# Patient Record
Sex: Female | Born: 1960 | Race: White | Hispanic: No | Marital: Single | Smoking: Never smoker
Health system: Southern US, Community
[De-identification: ages and names within clinical notes are randomized; demographics above are authoritative.]

## PROBLEM LIST (undated history)

## (undated) DIAGNOSIS — Z566 Other physical and mental strain related to work: Secondary | ICD-10-CM

## (undated) DIAGNOSIS — M7661 Achilles tendinitis, right leg: Secondary | ICD-10-CM

## (undated) DIAGNOSIS — D509 Iron deficiency anemia, unspecified: Secondary | ICD-10-CM

## (undated) DIAGNOSIS — R011 Cardiac murmur, unspecified: Secondary | ICD-10-CM

## (undated) DIAGNOSIS — F5101 Primary insomnia: Secondary | ICD-10-CM

## (undated) DIAGNOSIS — M898X7 Other specified disorders of bone, ankle and foot: Secondary | ICD-10-CM

## (undated) DIAGNOSIS — G4733 Obstructive sleep apnea (adult) (pediatric): Secondary | ICD-10-CM

## (undated) DIAGNOSIS — I10 Essential (primary) hypertension: Secondary | ICD-10-CM

## (undated) DIAGNOSIS — R002 Palpitations: Secondary | ICD-10-CM

## (undated) DIAGNOSIS — I447 Left bundle-branch block, unspecified: Secondary | ICD-10-CM

## (undated) HISTORY — PX: BREAST REDUCTION SURGERY: SHX8

## (undated) HISTORY — PX: CHOLECYSTECTOMY: SHX55

## (undated) HISTORY — DX: Obstructive sleep apnea (adult) (pediatric): G47.33

## (undated) HISTORY — DX: Primary insomnia: F51.01

## (undated) HISTORY — DX: Essential (primary) hypertension: I10

## (undated) HISTORY — DX: Palpitations: R00.2

## (undated) HISTORY — DX: Left bundle-branch block, unspecified: I44.7

## (undated) HISTORY — DX: Cardiac murmur, unspecified: R01.1

## (undated) HISTORY — DX: Other physical and mental strain related to work: Z56.6

## (undated) HISTORY — PX: GASTRIC FUNDOPLICATION: SHX226

## (undated) HISTORY — DX: Iron deficiency anemia, unspecified: D50.9

## (undated) HISTORY — PX: ACHILLES TENDON SURGERY: SHX542

## (undated) HISTORY — DX: Achilles tendinitis, right leg: M76.61

## (undated) HISTORY — DX: Other specified disorders of bone, ankle and foot: M89.8X7

## (undated) HISTORY — PX: GASTRIC BYPASS: SHX52

---

## 2014-08-30 DIAGNOSIS — R011 Cardiac murmur, unspecified: Secondary | ICD-10-CM

## 2014-08-30 DIAGNOSIS — I10 Essential (primary) hypertension: Secondary | ICD-10-CM

## 2014-08-30 HISTORY — DX: Essential (primary) hypertension: I10

## 2014-08-30 HISTORY — DX: Cardiac murmur, unspecified: R01.1

## 2015-04-12 DIAGNOSIS — D509 Iron deficiency anemia, unspecified: Secondary | ICD-10-CM | POA: Insufficient documentation

## 2015-04-12 DIAGNOSIS — Z566 Other physical and mental strain related to work: Secondary | ICD-10-CM | POA: Insufficient documentation

## 2015-04-12 HISTORY — DX: Other physical and mental strain related to work: Z56.6

## 2015-04-12 HISTORY — DX: Iron deficiency anemia, unspecified: D50.9

## 2015-05-03 DIAGNOSIS — M898X7 Other specified disorders of bone, ankle and foot: Secondary | ICD-10-CM | POA: Insufficient documentation

## 2015-05-03 DIAGNOSIS — M7661 Achilles tendinitis, right leg: Secondary | ICD-10-CM | POA: Insufficient documentation

## 2015-05-03 HISTORY — DX: Other specified disorders of bone, ankle and foot: M89.8X7

## 2015-05-03 HISTORY — DX: Achilles tendinitis, right leg: M76.61

## 2016-10-21 DIAGNOSIS — F5101 Primary insomnia: Secondary | ICD-10-CM | POA: Insufficient documentation

## 2016-10-21 HISTORY — DX: Primary insomnia: F51.01

## 2017-04-05 DIAGNOSIS — R002 Palpitations: Secondary | ICD-10-CM | POA: Insufficient documentation

## 2017-04-05 DIAGNOSIS — I447 Left bundle-branch block, unspecified: Secondary | ICD-10-CM

## 2017-04-05 HISTORY — DX: Palpitations: R00.2

## 2017-04-05 HISTORY — DX: Left bundle-branch block, unspecified: I44.7

## 2017-04-09 DIAGNOSIS — I493 Ventricular premature depolarization: Secondary | ICD-10-CM | POA: Insufficient documentation

## 2017-04-09 NOTE — Progress Notes (Signed)
Cardiology Office Note:    Date:  04/10/2017   ID:  Sophia SitesLisa Scott, DOB 10-06-1960, MRN [id number]  PCP:  Jamal CollinHedgecock, Suzanne, PA-C  Cardiologist:  Norman HerrlichBrian Mersedes Alber, MD    Referring MD: Jamal CollinHedgecock, Suzanne, PA-C    ASSESSMENT:    1. Palpitation   2. PVC's (premature ventricular contractions)   3. Left bundle branch block (LBBB)   4. Essential hypertension    PLAN:    In order of problems listed above:  1. Worsened she utilize an event monitor for 1 month document arrhythmia and formulate decision on treatment whether she requires an antiarrhythmic drug.  Recent labs for potassium requested from her PCP recheck echocardiogram at risk for cardiomyopathy with left bundle branch block 2. Continue beta-blocker 3. Check echocardiogram 4. Stable continue current treatment   Next appointment: 6 weeks   Medication Adjustments/Labs and Tests Ordered: Current medicines are reviewed at length with the patient today.  Concerns regarding medicines are outlined above.  Orders Placed This Encounter  Procedures  . Cardiac event monitor  . EKG 12-Lead  . ECHOCARDIOGRAM COMPLETE   No orders of the defined types were placed in this encounter.   Chief Complaint  Patient presents with  . Follow-up    to discuss irregular heart beat.      History of Present Illness:    Sophia SitesLisa Scott is a 57 y.o. female with a hx of 08/28/17 last seen hypertension, PVC's and LBBB. Compliance with diet, lifestyle and medications: Yes  Recently her symptoms have worsened one evening she had such severe palpitation that are frightened her and she thought of going to the emergency room.  The episodes are daily they occur in the evening hours and is forceful and rapid heartbeat that she complains of.  She is short of breath with palpitation but has no chest pain exercise intolerance exertional syncope edema or chest discomfort.  She takes no proarrhythmic medications. Past Medical History:  Diagnosis Date  . Essential  hypertension 08/30/2014  . Iron deficiency anemia 04/12/2015  . Left bundle branch block (LBBB) 04/05/2017  . Murmur 08/30/2014  . Palpitations 04/05/2017  . Primary insomnia 10/21/2016  . 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 Tendonitis, Achilles, right 05/03/2015  . Work-related stress 04/12/2015    Past Surgical History:  Procedure Laterality Date  . ACHILLES TENDON SURGERY Right   . BREAST REDUCTION SURGERY    . CHOLECYSTECTOMY    . GASTRIC BYPASS    . GASTRIC FUNDOPLICATION      Current Medications: Current Meds  Medication Sig  . clonazePAM (KLONOPIN) 1 MG tablet TAKE 1/2 TO 1 (ONE-HALF TO ONE) TABLET BY MOUTH UP TO TWICE DAILY AS NEEDED FOR ANXIETY  . metoprolol succinate (TOPROL-XL) 50 MG 24 hr tablet Take 50 mg by mouth daily.  . sertraline (ZOLOFT) 100 MG tablet Take 100 mg by mouth daily.  . traZODone (DESYREL) 50 MG tablet Take 50 mg by mouth daily as needed.     Allergies:   Chlorphen-pe-acetaminophen   Social History   Socioeconomic History  . Marital status: Single    Spouse name: None  . Number of children: None  . Years of education: None  . Highest education level: None  Social Needs  . Financial resource strain: None  . Food insecurity - worry: None  . Food insecurity - inability: None  . Transportation needs - medical: None  . Transportation needs - non-medical: None  Occupational History  . None  Tobacco Use  . Smoking status: Never  Smoker  . Smokeless tobacco: Never Used  Substance and Sexual Activity  . Alcohol use: Yes    Frequency: Never    Comment: occassionaly  . Drug use: No  . Sexual activity: None  Other Topics Concern  . None  Social History Narrative  . None     Family History: The patient's family history includes Cancer in her father; Heart disease in her father; Hypertension in her mother. ROS:   Please see the history of present illness.    All other systems reviewed and are negative.  EKGs/Labs/Other Studies Reviewed:     The following studies were reviewed today:  EKG:  EKG ordered today.  The ekg ordered today demonstrates sinus rhythm left bundle branch block  Recent Labs: No results found for requested labs within last 8760 hours.  Recent Lipid Panel No results found for: CHOL, TRIG, HDL, CHOLHDL, VLDL, LDLCALC, LDLDIRECT  Physical Exam:    VS:  BP 122/82 (BP Location: Right Arm, Patient Position: Sitting, Cuff Size: Normal)   Pulse 72   Ht 5\' 2"  (1.575 m)   Wt 181 lb 6.4 oz (82.3 kg)   SpO2 99%   BMI 33.18 kg/m     Wt Readings from Last 3 Encounters:  04/10/17 181 lb 6.4 oz (82.3 kg)     GEN:  Well nourished, well developed in no acute distress HEENT: Normal NECK: No JVD; No carotid bruits LYMPHATICS: No lymphadenopathy CARDIAC: paradoxical S2 RRR, no murmurs, rubs, gallops RESPIRATORY:  Clear to auscultation without rales, wheezing or rhonchi  ABDOMEN: Soft, non-tender, non-distended MUSCULOSKELETAL:  No edema; No deformity  SKIN: Warm and dry NEUROLOGIC:  Alert and oriented x 3 PSYCHIATRIC:  Normal affect    Signed, [name], MD  04/10/2017 11:32 AM    [facility]

## 2017-04-10 ENCOUNTER — Encounter: Payer: Self-pay | Admitting: *Deleted

## 2017-04-10 ENCOUNTER — Ambulatory Visit: Payer: BC Managed Care – PPO | Admitting: Cardiology

## 2017-04-10 ENCOUNTER — Encounter: Payer: Self-pay | Admitting: Cardiology

## 2017-04-10 VITALS — BP 122/82 | HR 72 | Ht 62.0 in | Wt 181.4 lb

## 2017-04-10 DIAGNOSIS — R002 Palpitations: Secondary | ICD-10-CM

## 2017-04-10 DIAGNOSIS — I493 Ventricular premature depolarization: Secondary | ICD-10-CM | POA: Diagnosis not present

## 2017-04-10 DIAGNOSIS — I10 Essential (primary) hypertension: Secondary | ICD-10-CM | POA: Diagnosis not present

## 2017-04-10 DIAGNOSIS — I447 Left bundle-branch block, unspecified: Secondary | ICD-10-CM

## 2017-04-10 NOTE — Patient Instructions (Addendum)
  Medication Instructions:  Your physician recommends that you continue on your current medications as directed. Please refer to the Current Medication list given to you today.   Labwork: None  Testing/Procedures: You had an EKG today.   Your physician has recommended that you wear an event monitor. Event monitors are medical devices that record the heart's electrical activity. Doctors most often us these monitors to diagnose arrhythmias. Arrhythmias are problems with the speed or rhythm of the heartbeat. The monitor is a small, portable device. You can wear one while you do your normal daily activities. This is usually used to diagnose what is causing palpitations/syncope (passing out). Wear for 30 days.   Your physician has requested that you have an echocardiogram. Echocardiography is a painless test that uses sound waves to create images of your heart. It provides your doctor with information about the size and shape of your heart and how well your heart's chambers and valves are working. This procedure takes approximately one hour. There are no restrictions for this procedure.    Follow-Up: Your physician recommends that you schedule a follow-up appointment in: 6 weeks.   Any Other Special Instructions Will Be Listed Below (If Applicable).     If you need a refill on your cardiac medications before your next appointment, please call your pharmacy.     1. Avoid all over-the-counter antihistamines except Claritin/Loratadine and Zyrtec/Cetrizine. 2. Avoid all combination including cold sinus allergies flu decongestant and sleep medications 3. You can use Robitussin DM Mucinex and Mucinex DM for cough. 4. can use Tylenol aspirin ibuprofen and naproxen but no combinations such as sleep or sinus.  KardiaMobile [email or web address]        FDA-cleared, clinical grade mobile EKG monitor: Lourena SimmondsKardia is the most clinically-validated mobile EKG used by the  world's leading cardiac care medical professionals With Basic service, know instantly if your heart rhythm is normal or if atrial fibrillation is detected, and email the last single EKG recording to yourself or your doctor Premium service, available for purchase through the Kardia app for $9.99 per month or $99 per year, includes unlimited history and storage of your EKG recordings, a monthly EKG summary report to share with your doctor, along with the ability to track your blood pressure, activity and weight Includes one KardiaMobile phone clip FREE SHIPPING: Standard delivery 1-3 business days. Orders placed by 11:00am PST will ship that afternoon. Otherwise, will ship next business day. All orders ship via [employer] Priority Mail from [place]

## 2017-04-13 ENCOUNTER — Ambulatory Visit (HOSPITAL_BASED_OUTPATIENT_CLINIC_OR_DEPARTMENT_OTHER)
Admission: RE | Admit: 2017-04-13 | Discharge: 2017-04-13 | Disposition: A | Discharge status: Home / Self Care | Payer: BC Managed Care – PPO | Source: Ambulatory Visit | Attending: Cardiology | Admitting: Cardiology

## 2017-04-13 ENCOUNTER — Ambulatory Visit: Payer: BC Managed Care – PPO

## 2017-04-13 DIAGNOSIS — R002 Palpitations: Secondary | ICD-10-CM | POA: Insufficient documentation

## 2017-04-13 DIAGNOSIS — I471 Supraventricular tachycardia: Secondary | ICD-10-CM | POA: Diagnosis not present

## 2017-04-13 DIAGNOSIS — I10 Essential (primary) hypertension: Secondary | ICD-10-CM | POA: Insufficient documentation

## 2017-04-13 NOTE — Progress Notes (Signed)
Echocardiogram 2D Echocardiogram has been performed.  Dorothey BasemanReel, Anjolaoluwa Siguenza M 04/13/2017, 12:02 PM

## 2017-04-23 ENCOUNTER — Other Ambulatory Visit: Payer: Self-pay | Admitting: Cardiology

## 2017-04-23 DIAGNOSIS — I471 Supraventricular tachycardia: Secondary | ICD-10-CM

## 2017-04-23 MED ORDER — DILTIAZEM HCL ER COATED BEADS 240 MG PO CP24: 240.0000 mg | ORAL_CAPSULE | Freq: Every day | ORAL | 3 refills | Status: DC

## 2017-04-24 ENCOUNTER — Telehealth: Payer: Self-pay

## 2017-04-24 NOTE — Telephone Encounter (Signed)
Spoke with patient advising her that she has had 3 events which did show us that she was having fast heart beat (SVT). I have let her know that Diltiazem 240 mg, 1 time daily. Patient understands to continue to wear her monitor to make sure she is not having any more episodes. I advised her to let us know if she had any issues with Diltiazem.

## 2017-04-30 ENCOUNTER — Other Ambulatory Visit: Payer: Self-pay

## 2017-04-30 DIAGNOSIS — I447 Left bundle-branch block, unspecified: Secondary | ICD-10-CM

## 2017-04-30 DIAGNOSIS — R002 Palpitations: Secondary | ICD-10-CM

## 2017-05-01 ENCOUNTER — Encounter: Payer: Self-pay | Admitting: Cardiology

## 2017-05-01 ENCOUNTER — Ambulatory Visit: Payer: BC Managed Care – PPO | Admitting: Cardiology

## 2017-05-01 ENCOUNTER — Telehealth: Payer: Self-pay | Admitting: *Deleted

## 2017-05-01 ENCOUNTER — Other Ambulatory Visit: Payer: Self-pay | Admitting: Cardiology

## 2017-05-01 VITALS — BP 130/78 | HR 66 | Ht 62.0 in | Wt 184.0 lb

## 2017-05-01 DIAGNOSIS — I447 Left bundle-branch block, unspecified: Secondary | ICD-10-CM

## 2017-05-01 DIAGNOSIS — I471 Supraventricular tachycardia: Secondary | ICD-10-CM | POA: Diagnosis not present

## 2017-05-01 DIAGNOSIS — Z01812 Encounter for preprocedural laboratory examination: Secondary | ICD-10-CM | POA: Diagnosis not present

## 2017-05-01 DIAGNOSIS — D500 Iron deficiency anemia secondary to blood loss (chronic): Secondary | ICD-10-CM

## 2017-05-01 DIAGNOSIS — I10 Essential (primary) hypertension: Secondary | ICD-10-CM

## 2017-05-01 NOTE — Progress Notes (Signed)
Electrophysiology Office Note   Date:  05/01/2017   ID:  Sophia SitesLisa Scott, DOB 05/25/1960, MRN [id number]  PCP:  Sophia CollinHedgecock, Suzanne, PA-C  Cardiologist:  Sophia Scott Primary Electrophysiologist:  Sophia LemmingWill Martin Marda Breidenbach, MD    Chief Complaint  Patient presents with  . Advice Only    Ventricular standstill     History of Present Illness: Sophia Scott is a 57 y.o. female who is being seen today for the evaluation of SVT at the request of Sophia HerrlichBrian Scott. Presenting today for electrophysiology evaluation.  She has a history of hypertension, left bundle branch block.  She presented to cardiology clinic with severe palpitations.  She went to the emergency room due to this.  They occur mainly in the evening hours and feel like a forceful rapid heartbeat.  She has shortness of breath but no chest pain.  Heart attack monitor showed episodes of SVT as well as ventricular standstill.  Her rate control has since been stopped.  Today, she denies symptoms of palpitations, chest pain, shortness of breath, orthopnea, PND, lower extremity edema, claudication, dizziness, presyncope, syncope, bleeding, or neurologic sequela. The patient is tolerating medications without difficulties.    Past Medical History:  Diagnosis Date  . Essential hypertension 08/30/2014  . Iron deficiency anemia 04/12/2015  . Left bundle branch block (LBBB) 04/05/2017  . Murmur 08/30/2014  . Palpitations 04/05/2017  . Primary insomnia 10/21/2016  . 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 Tendonitis, Achilles, right 05/03/2015  . Work-related stress 04/12/2015   Past Surgical History:  Procedure Laterality Date  . ACHILLES TENDON SURGERY Right   . BREAST REDUCTION SURGERY    . CHOLECYSTECTOMY    . GASTRIC BYPASS    . GASTRIC FUNDOPLICATION       Current Outpatient Medications  Medication Sig Dispense Refill  . clonazePAM (KLONOPIN) 1 MG tablet TAKE 1/2 TO 1 (ONE-HALF TO ONE) TABLET BY MOUTH UP TO TWICE DAILY AS NEEDED FOR ANXIETY    . olmesartan  (BENICAR) 40 MG tablet Take 40 mg by mouth daily.    . ondansetron (ZOFRAN) 4 MG tablet Take 4 mg by mouth as needed.    . sertraline (ZOLOFT) 100 MG tablet Take 100 mg by mouth daily.    . traZODone (DESYREL) 50 MG tablet Take 50 mg by mouth daily as needed.     No current facility-administered medications for this visit.     Allergies:   Chlorphen-pe-acetaminophen   Social History:  The patient  reports that she has never smoked. She has never used smokeless tobacco. She reports that she drinks alcohol. She reports that she does not use drugs.   Family History:  The patient's family history includes Cancer in her father; Heart disease in her father; Hypertension in her mother.    ROS:  Please see the history of present illness.   Otherwise, review of systems is positive for chest pain, palpitations, hearing loss, anxiety.   All other systems are reviewed and negative.    PHYSICAL EXAM: VS:  BP 130/78   Pulse 66   Ht 5\' 2"  (1.575 m)   Wt 184 lb (83.5 kg)   SpO2 99%   BMI 33.65 kg/m  , BMI Body mass index is 33.65 kg/m. GEN: Well nourished, well developed, in no acute distress  HEENT: normal  Neck: no JVD, carotid bruits, or masses Cardiac: RRR; no murmurs, rubs, or gallops,no edema  Respiratory:  clear to auscultation bilaterally, normal work of breathing GI: soft, nontender, nondistended, + BS MS: no  deformity or atrophy  Skin: warm and dry Neuro:  Strength and sensation are intact Psych: euthymic mood, full affect  EKG:  EKG is not ordered today. Personal review of the ekg ordered 04/09/17 shows sinus rhythm, left bundle branch block  04/09/17 Device interrogation is reviewed today in detail.  See PaceArt for details.   Recent Labs: No results found for requested labs within last 8760 hours.    Lipid Panel  No results found for: CHOL, TRIG, HDL, CHOLHDL, VLDL, LDLCALC, LDLDIRECT   Wt Readings from Last 3 Encounters:  05/01/17 184 lb (83.5 kg)  04/10/17 181 lb 6.4  oz (82.3 kg)      Other studies Reviewed: Additional studies/ records that were reviewed today include: TTE 04/13/17  Review of the above records today demonstrates:  - Left ventricle: The cavity size was normal. Wall thickness was   normal. Systolic function was normal. The estimated ejection   fraction was in the range of 55% to 60%. Wall motion was normal;   there were no regional wall motion abnormalities. There was an   increased relative contribution of atrial contraction to   ventricular filling. Doppler parameters are consistent with high   ventricular filling pressure. - Aortic valve: Valve area (VTI): 1.79 cm^2. Valve area (Vmax):   1.81 cm^2. Valve area (Vmean): 1.78 cm^2. - Left atrium: The atrium was mildly dilated.  30-day monitor-personally reviewed Sinus rhythm, left bundle branch block, SVT, intermittent AV block  ASSESSMENT AND PLAN:  1.  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  Appeared on her cardiac monitor.  She does have a chronic left bundle branch block which was unchanged during her tachycardia.  She has a normal ejection fraction.  Due to her SVT, [name] plan for ablation.  Risks and benefits were discussed.  Risks include bleeding, tamponade, heart block, and stroke, among others.  She understands these risks and is agreed to the procedure.  Discussed with her vagal maneuvers in case she goes back into SVT.  2.  Left bundle branch block: Chronic.  Has a normal ejection fraction.  Continue to monitor.  3.  Intermittent AV block: Occurred while she was on rate controlling medications for her SVT.  These have been stopped.  Plan for SVT ablation.  Case discussed with primary cardiology  Current medicines are reviewed at length with the patient today.   The patient does not have concerns regarding her medicines.  The following changes were made today:  none  Labs/ tests ordered today include:  Orders Placed This Encounter  Procedures  . Basic Metabolic Panel (BMET)    . CBC w/Diff     Disposition:   FU with [name] 7 weeks  Signed, Sophia Scott Sophia Loa, MD  05/01/2017 10:56 AM     [facility] [address] [place] [address] [phone] (office) [phone] (fax)

## 2017-05-01 NOTE — Telephone Encounter (Signed)
Pt cannot go on 4/25 for ablation.  She would like to move it up to 4/16. Advised pt to have pre procedure labs (in [facility] office) b/t 4/2 - 4/15. Rescheduled post ablation f/u appt to 5/22 in the [facility] office. Pt aware to [facility] SVT ablation on 05/19/17. She is aware all other instructions given to her this morning remain the same. Patient verbalized understanding and agreeable to plan.

## 2017-05-01 NOTE — Patient Instructions (Addendum)
Medication Instructions:  Your physician recommends that you continue on your current medications as directed. Please refer to the Current Medication list given to you today.  Labwork: Return for pre procedure lab work between: 4/11 - 4/24 for BMET & CBC  *Will notify you of abnormal results, otherwise continue current treatment plan.  Testing/Procedures: Your physician has recommended that you have an ablation. Catheter ablation is a medical procedure used to treat some cardiac arrhythmias (irregular heartbeats). During catheter ablation, a long, thin, flexible tube is put into a blood vessel in your groin (upper thigh), or neck. This tube is called an ablation catheter. It is then guided to your heart through the blood vessel. Radio frequency waves destroy small areas of heart tissue where abnormal heartbeats may cause an arrhythmia to start.  Instructions for your ablation: 1. Please arrive at the [facility], Main Entrance "A", of [facility] at 9:30 a.m. on 05/28/2017. 2. Do not eat or drink after midnight the night prior to the procedure. 3. You may take your morning medications with a sip of water. 4. You will shaved for this procedure (if needed). Typically both groins, chest and back  are shaven. We ask that you shave these areas yourself at home 1-2 days prior  to the procedure. If you are uncomfortable/unable, then hospital staff will shave  these areas the morning of your procedure. 5. Plan for an overnight stay in the hospital. 6. You will need someone to drive you home at discharge.   Follow-Up: Your physician recommends that you schedule a follow-up appointment in: 4 weeks, after your procedure on 05/28/2017, with Dr. Elberta Fortis.  * If you need a refill on your cardiac medications before your next appointment, please call your pharmacy.   *Please note that any paperwork needing to be filled out by the provider will need to be addressed at the front desk prior to seeing the  provider. Please note that any FMLA, disability or other documents regarding health condition is subject to a $25.00 charge that must be received prior to completion of paperwork in the form of a money order or check.  Thank you for choosing [facility]!!   Dory Horn, RN [phone]  Any Other Special Instructions Will Be Listed Below (If Applicable).   Cardiac Ablation Cardiac ablation is a procedure to disable (ablate) a small amount of heart tissue in very specific places. The heart has many electrical connections. Sometimes these connections are abnormal and can cause the heart to beat very fast or irregularly. Ablating some of the problem areas can improve the heart rhythm or return it to normal. Ablation may be done for people who:  Have Wolff-Parkinson-White syndrome.  Have fast heart rhythms (tachycardia).  Have taken medicines for an abnormal heart rhythm (arrhythmia) that were not effective or caused side effects.  Have a high-risk heartbeat that may be life-threatening.  During the procedure, a small incision is made in the neck or the groin, and a long, thin, flexible tube (catheter) is inserted into the incision and moved to the heart. Small devices (electrodes) on the tip of the catheter will send out electrical currents. A type of X-ray (fluoroscopy) will be used to help guide the catheter and to provide images of the heart. Tell a health care provider about:  Any allergies you have.  All medicines you are taking, including vitamins, herbs, eye drops, creams, and over-the-counter medicines.  Any problems you or family members have had with anesthetic medicines.  Any blood disorders you have.  Any surgeries you have had.  Any medical conditions you have, such as kidney failure.  Whether you are pregnant or may be pregnant. What are the risks? Generally, this is a safe procedure. However, problems may occur, including:  Infection.  Bruising and  bleeding at the catheter insertion site.  Bleeding into the chest, especially into the sac that surrounds the heart. This is a serious complication.  Stroke or blood clots.  Damage to other structures or organs.  Allergic reaction to medicines or dyes.  Need for a permanent pacemaker if the normal electrical system is damaged. A pacemaker is a small computer that sends electrical signals to the heart and helps your heart beat normally.  The procedure not being fully effective. This may not be recognized until months later. Repeat ablation procedures are sometimes required.  What happens before the procedure?  Follow instructions from your health care provider about eating or drinking restrictions.  Ask your health care provider about: ? Changing or stopping your regular medicines. This is especially important if you are taking diabetes medicines or blood thinners. ? Taking medicines such as aspirin and ibuprofen. These medicines can thin your blood. Do not take these medicines before your procedure if your health care provider instructs you not to.  Plan to have someone take you home from the hospital or clinic.  If you will be going home right after the procedure, plan to have someone with you for 24 hours. What happens during the procedure?  To lower your risk of infection: ? Your health care team will wash or sanitize their hands. ? Your skin will be washed with soap. ? Hair may be removed from the incision area.  An IV tube will be inserted into one of your veins.  You will be given a medicine to help you relax (sedative).  The skin on your neck or groin will be numbed.  An incision will be made in your neck or your groin.  A needle will be inserted through the incision and into a large vein in your neck or groin.  A catheter will be inserted into the needle and moved to your heart.  Dye may be injected through the catheter to help your surgeon see the area of the  heart that needs treatment.  Electrical currents will be sent from the catheter to ablate heart tissue in desired areas. There are three types of energy that may be used to ablate heart tissue: ? Heat (radiofrequency energy). ? Laser energy. ? Extreme cold (cryoablation).  When the necessary tissue has been ablated, the catheter will be removed.  Pressure will be held on the catheter insertion area to prevent excessive bleeding.  A bandage (dressing) will be placed over the catheter insertion area. The procedure may vary among health care providers and hospitals. What happens after the procedure?  Your blood pressure, heart rate, breathing rate, and blood oxygen level will be monitored until the medicines you were given have worn off.  Your catheter insertion area will be monitored for bleeding. You will need to lie still for a few hours to ensure that you do not bleed from the catheter insertion area.  Do not drive for 5-7 days or as long as directed by your health care provider. Summary  Cardiac ablation is a procedure to disable (ablate) a small amount of heart tissue in very specific places. Ablating some of the problem areas can improve the heart rhythm or return  it to normal.  During the procedure, electrical currents will be sent from the catheter to ablate heart tissue in desired areas. This information is not intended to replace advice given to you by your health care provider. Make sure you discuss any questions you have with your health care provider. Document Released: 06/08/2008 Document Revised: 12/10/2015 Document Reviewed: 12/10/2015 Elsevier Interactive Patient Education  [employer].

## 2017-05-05 ENCOUNTER — Institutional Professional Consult (permissible substitution): Payer: 59 | Admitting: Cardiology

## 2017-05-14 ENCOUNTER — Other Ambulatory Visit: Payer: Self-pay

## 2017-05-14 DIAGNOSIS — Z01812 Encounter for preprocedural laboratory examination: Secondary | ICD-10-CM

## 2017-05-14 DIAGNOSIS — I471 Supraventricular tachycardia: Secondary | ICD-10-CM

## 2017-05-14 NOTE — Addendum Note (Signed)
Addended by: Sharene ButtersWELLS, Betzayda Braxton E on: 05/14/2017 04:23 PM   Modules accepted: Orders

## 2017-05-15 LAB — CBC WITH DIFFERENTIAL/PLATELET
Basophils Absolute: 0 10*3/uL (ref 0.0–0.2)
Basos: 1 %
EOS (ABSOLUTE): 0.1 10*3/uL (ref 0.0–0.4)
EOS: 1 %
HEMATOCRIT: 30.5 % — AB (ref 34.0–46.6)
HEMOGLOBIN: 9.1 g/dL — AB (ref 11.1–15.9)
IMMATURE GRANS (ABS): 0 10*3/uL (ref 0.0–0.1)
IMMATURE GRANULOCYTES: 0 %
LYMPHS ABS: 1.2 10*3/uL (ref 0.7–3.1)
LYMPHS: 31 %
MCH: 22.4 pg — ABNORMAL LOW (ref 26.6–33.0)
MCHC: 29.8 g/dL — ABNORMAL LOW (ref 31.5–35.7)
MCV: 75 fL — ABNORMAL LOW (ref 79–97)
MONOCYTES: 11 %
Monocytes Absolute: 0.5 10*3/uL (ref 0.1–0.9)
NEUTROS PCT: 56 %
Neutrophils Absolute: 2.2 10*3/uL (ref 1.4–7.0)
Platelets: 334 10*3/uL (ref 150–379)
RBC: 4.06 x10E6/uL (ref 3.77–5.28)
RDW: 16.5 % — ABNORMAL HIGH (ref 12.3–15.4)
WBC: 4 10*3/uL (ref 3.4–10.8)

## 2017-05-15 LAB — BASIC METABOLIC PANEL
BUN/Creatinine Ratio: 11 (ref 9–23)
BUN: 8 mg/dL (ref 6–24)
CALCIUM: 9.2 mg/dL (ref 8.7–10.2)
CO2: 27 mmol/L (ref 20–29)
CREATININE: 0.73 mg/dL (ref 0.57–1.00)
Chloride: 100 mmol/L (ref 96–106)
GFR calc Af Amer: 108 mL/min/{1.73_m2} (ref >59)
GFR calc non Af Amer: 94 mL/min/{1.73_m2} (ref >59)
Glucose: 85 mg/dL (ref 65–99)
Potassium: 4.4 mmol/L (ref 3.5–5.2)
Sodium: 141 mmol/L (ref 134–144)

## 2017-05-15 LAB — SPECIMEN STATUS REPORT

## 2017-05-18 ENCOUNTER — Telehealth: Payer: Self-pay | Admitting: Cardiology

## 2017-05-18 NOTE — Telephone Encounter (Signed)
Pt calls in reporting sickness that "came on Friday".  Reports going to urgent care Saturday, flu test negative, but started on Tamiflu anyway. She also reports a sinus infection with low grade fever. Pt scheduled for SVT ablation tomorrow.  Cancelling procedure and re-scheduling for 5/2. Pt aware I will call her with instructions.

## 2017-05-18 NOTE — Telephone Encounter (Signed)
New message:     Pt states she is sick and due to have a procedure done on tomorrow and pt would like to know how to proceed.

## 2017-05-21 NOTE — Telephone Encounter (Signed)
Reviewed procedure instructions w/ patient. Advised to arrive [facility] at 8am on 06/04/17. Post procedure follow up rescheduled for 07/13/17. Patient verbalized understanding and agreeable to plan.

## 2017-05-21 NOTE — Progress Notes (Signed)
Cardiology Office Note:    Date:  05/22/2017   ID:  Sophia Scott, Sophia Scott 1960/03/18, MRN [id number]  PCP:  Jamal Collin, PA-C  Cardiologist:  [name], MD    Referring MD: Jamal Collin, PA-C    ASSESSMENT:    1. Left bundle branch block (LBBB)   2. SVT (supraventricular tachycardia) (HCC)   3. Essential hypertension   4. PVC's (premature ventricular contractions)   5. Heart block AV third degree (HCC)    PLAN:    In order of problems listed above:  1. Stable on recent EKG 2. Stable awaiting catheter ablation 3. Stable blood pressure target continue current treatment 4. Stable beta-blocker is on hold 5. Improved off combined beta-blocker calcium channel blocker therapy   Next appointment: 6 months   Medication Adjustments/Labs and Tests Ordered: Current medicines are reviewed at length with the patient today.  Concerns regarding medicines are outlined above.  No orders of the defined types were placed in this encounter.  No orders of the defined types were placed in this encounter.   Chief Complaint  Patient presents with  . Follow up testing    History of Present Illness:    Sophia Scott is a 57 y.o. female with a hx of hypertension, PVC's and LBBB last seen 04/10/17 Her EM showed SVT and subsequent pause and is scheduled for EP ablation 06/04/17. Compliance with diet, lifestyle and medications: Yes She is noted no syncope or sustained arrhythmia and has been seen by EP and is planned for catheter ablation of her SVT 06/04/2017.  She avoids over-the-counter proarrhythmic drugs Past Medical History:  Diagnosis Date  . Essential hypertension 08/30/2014  . Iron deficiency anemia 04/12/2015  . Left bundle branch block (LBBB) 04/05/2017  . Murmur 08/30/2014  . Palpitations 04/05/2017  . Primary insomnia 10/21/2016  . 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 Tendonitis, Achilles, right 05/03/2015  . Work-related stress 04/12/2015    Past Surgical  History:  Procedure Laterality Date  . ACHILLES TENDON SURGERY Right   . BREAST REDUCTION SURGERY    . CHOLECYSTECTOMY    . GASTRIC BYPASS    . GASTRIC FUNDOPLICATION      Current Medications: Current Meds  Medication Sig  . amoxicillin-clavulanate (AUGMENTIN) 875-125 MG tablet Take 1 tablet by mouth 2 (two) times daily.  . clonazePAM (KLONOPIN) 1 MG tablet TAKE 1/2 TO 1 (ONE-HALF TO ONE) TABLET BY MOUTH UP TO TWICE DAILY AS NEEDED FOR ANXIETY  . ondansetron (ZOFRAN) 4 MG tablet Take 4 mg by mouth every 8 (eight) hours as needed for nausea.   Marland Kitchen oxymetazoline (VICKS SINEX 12 HOUR DECONGEST) 0.05 % nasal spray Place 1 spray into both nostrils 2 (two) times daily as needed for congestion.  . sertraline (ZOLOFT) 100 MG tablet Take 100 mg by mouth daily.  . traZODone (DESYREL) 50 MG tablet Take 50 mg by mouth at bedtime.      Allergies:   Chlorphen-pe-acetaminophen   Social History   Socioeconomic History  . Marital status: Single    Spouse name: Not on file  . Number of children: Not on file  . Years of education: Not on file  . Highest education level: Not on file  Occupational History  . Not on file  Social Needs  . Financial resource strain: Not on file  . Food insecurity:    Worry: Not on file    Inability: Not on file  . Transportation needs:    Medical: Not on file  Non-medical: Not on file  Tobacco Use  . Smoking status: Never Smoker  . Smokeless tobacco: Never Used  Substance and Sexual Activity  . Alcohol use: Yes    Frequency: Never    Comment: occassionaly  . Drug use: No  . Sexual activity: Not on file  Lifestyle  . Physical activity:    Days per week: Not on file    Minutes per session: Not on file  . Stress: Not on file  Relationships  . Social connections:    Talks on phone: Not on file    Gets together: Not on file    Attends religious service: Not on file    Active member of club or organization: Not on file    Attends meetings of clubs or  organizations: Not on file    Relationship status: Not on file  Other Topics Concern  . Not on file  Social History Narrative  . Not on file     Family History: The patient's family history includes Cancer in her father; Heart disease in her father; Hypertension in her mother. ROS:   Please see the history of present illness.    All other systems reviewed and are negative.  EKGs/Labs/Other Studies Reviewed:    The following studies were reviewed   Recent Labs: 05/14/2017: BUN 8; Creatinine, Ser 0.73; Hemoglobin 9.1; Platelets 334; Potassium 4.4; Sodium 141  Recent Lipid Panel No results found for: CHOL, TRIG, HDL, CHOLHDL, VLDL, LDLCALC, LDLDIRECT  Physical Exam:    VS:  BP 130/80   Pulse 82   Ht 5\' 2"  (1.575 m)   Wt 181 lb 1.9 oz (82.2 kg)   SpO2 98%   BMI 33.13 kg/m     Wt Readings from Last 3 Encounters:  05/22/17 181 lb 1.9 oz (82.2 kg)  05/01/17 184 lb (83.5 kg)  04/10/17 181 lb 6.4 oz (82.3 kg)     GEN:  Well nourished, well developed in no acute distress HEENT: Normal NECK: No JVD; No carotid bruits LYMPHATICS: No lymphadenopathy CARDIAC: RRR, no murmurs, rubs, gallops RESPIRATORY:  Clear to auscultation without rales, wheezing or rhonchi  ABDOMEN: Soft, non-tender, non-distended MUSCULOSKELETAL:  No edema; No deformity  SKIN: Warm and dry NEUROLOGIC:  Alert and oriented x 3 PSYCHIATRIC:  Normal affect    Signed, Norman HerrlichBrian Jenayah Antu, MD  05/22/2017 8:22 AM    [facility]

## 2017-05-22 ENCOUNTER — Encounter: Payer: Self-pay | Admitting: Cardiology

## 2017-05-22 ENCOUNTER — Ambulatory Visit: Payer: BC Managed Care – PPO | Admitting: Cardiology

## 2017-05-22 VITALS — BP 130/80 | HR 82 | Ht 62.0 in | Wt 181.1 lb

## 2017-05-22 DIAGNOSIS — I493 Ventricular premature depolarization: Secondary | ICD-10-CM

## 2017-05-22 DIAGNOSIS — I471 Supraventricular tachycardia: Secondary | ICD-10-CM | POA: Diagnosis not present

## 2017-05-22 DIAGNOSIS — I447 Left bundle-branch block, unspecified: Secondary | ICD-10-CM | POA: Diagnosis not present

## 2017-05-22 DIAGNOSIS — I10 Essential (primary) hypertension: Secondary | ICD-10-CM

## 2017-05-22 DIAGNOSIS — I442 Atrioventricular block, complete: Secondary | ICD-10-CM | POA: Diagnosis not present

## 2017-05-22 DIAGNOSIS — Z8679 Personal history of other diseases of the circulatory system: Secondary | ICD-10-CM | POA: Insufficient documentation

## 2017-05-22 NOTE — Patient Instructions (Addendum)
Medication Instructions:  Your physician recommends that you continue on your current medications as directed. Please refer to the Current Medication list given to you today.   Labwork: None  Testing/Procedures: None  Follow-Up: Your physician wants you to follow-up in: 6 months. You will receive a reminder letter in the mail two months in advance. If you don't receive a letter, please call our office to schedule the follow-up appointment.   Any Other Special Instructions Will Be Listed Below (If Applicable).     If you need a refill on your cardiac medications before your next appointment, please call your pharmacy.     1. Avoid all over-the-counter antihistamines except Claritin/Loratadine and Zyrtec/Cetrizine. 2. Avoid all combination including cold sinus allergies flu decongestant and sleep medications 3. You can use Robitussin DM Mucinex and Mucinex DM for cough. 4. can use Tylenol aspirin ibuprofen and naproxen but no combinations such as sleep or sinus. 

## 2017-06-03 ENCOUNTER — Telehealth: Payer: Self-pay | Admitting: *Deleted

## 2017-06-03 NOTE — Telephone Encounter (Signed)
Advised pt to arrive at [facility] hospital tomorrow morning at 5:30 a.m. (orginally to arrive at 8am) due to schedule change. Patient verbalized understanding and agreeable to plan.

## 2017-06-04 ENCOUNTER — Encounter (HOSPITAL_COMMUNITY)
Admission: RE | Disposition: A | Discharge status: Home / Self Care | Payer: Self-pay | Source: Ambulatory Visit | Attending: Cardiology

## 2017-06-04 ENCOUNTER — Ambulatory Visit (HOSPITAL_COMMUNITY)
Admission: RE | Admit: 2017-06-04 | Discharge: 2017-06-04 | Disposition: A | Discharge status: Home / Self Care | Payer: BC Managed Care – PPO | Source: Ambulatory Visit | Attending: Cardiology | Admitting: Cardiology

## 2017-06-04 DIAGNOSIS — Z8249 Family history of ischemic heart disease and other diseases of the circulatory system: Secondary | ICD-10-CM | POA: Diagnosis not present

## 2017-06-04 DIAGNOSIS — I10 Essential (primary) hypertension: Secondary | ICD-10-CM | POA: Insufficient documentation

## 2017-06-04 DIAGNOSIS — Z9884 Bariatric surgery status: Secondary | ICD-10-CM | POA: Insufficient documentation

## 2017-06-04 DIAGNOSIS — I447 Left bundle-branch block, unspecified: Secondary | ICD-10-CM | POA: Diagnosis not present

## 2017-06-04 DIAGNOSIS — I471 Supraventricular tachycardia: Secondary | ICD-10-CM | POA: Insufficient documentation

## 2017-06-04 DIAGNOSIS — F5101 Primary insomnia: Secondary | ICD-10-CM | POA: Insufficient documentation

## 2017-06-04 HISTORY — PX: SVT ABLATION: EP1225

## 2017-06-04 SURGERY — SVT ABLATION

## 2017-06-04 MED ORDER — FENTANYL CITRATE (PF) 100 MCG/2ML IJ SOLN
INTRAMUSCULAR | Status: DC | PRN
  Administered 2017-06-04 (×3): 25 ug via INTRAVENOUS

## 2017-06-04 MED ORDER — SODIUM CHLORIDE 0.9% FLUSH: 3.0000 mL | Freq: Two times a day (BID) | INTRAVENOUS | Status: DC

## 2017-06-04 MED ORDER — MIDAZOLAM HCL 5 MG/5ML IJ SOLN
INTRAMUSCULAR | Status: AC
  Filled 2017-06-04: qty 5

## 2017-06-04 MED ORDER — BUPIVACAINE HCL (PF) 0.25 % IJ SOLN
INTRAMUSCULAR | Status: DC | PRN
  Administered 2017-06-04: 45 mL

## 2017-06-04 MED ORDER — ISOPROTERENOL HCL 0.2 MG/ML IJ SOLN
INTRAMUSCULAR | Status: AC
  Filled 2017-06-04: qty 5

## 2017-06-04 MED ORDER — MIDAZOLAM HCL 5 MG/5ML IJ SOLN
INTRAMUSCULAR | Status: DC | PRN
  Administered 2017-06-04 (×4): 1 mg via INTRAVENOUS

## 2017-06-04 MED ORDER — SODIUM CHLORIDE 0.9 % IV SOLN
INTRAVENOUS | Status: DC | PRN
  Administered 2017-06-04: 2 ug/min via INTRAVENOUS

## 2017-06-04 MED ORDER — SODIUM CHLORIDE 0.9 % IV SOLN: 250.0000 mL | INTRAVENOUS | Status: DC | PRN

## 2017-06-04 MED ORDER — FENTANYL CITRATE (PF) 100 MCG/2ML IJ SOLN
INTRAMUSCULAR | Status: AC
  Filled 2017-06-04: qty 2

## 2017-06-04 MED ORDER — HEPARIN (PORCINE) IN NACL 2-0.9 UNITS/ML
INTRAMUSCULAR | Status: DC | PRN
  Administered 2017-06-04: 1000 mL

## 2017-06-04 MED ORDER — SODIUM CHLORIDE 0.9% FLUSH: 3.0000 mL | INTRAVENOUS | Status: DC | PRN

## 2017-06-04 MED ORDER — BUPIVACAINE HCL (PF) 0.25 % IJ SOLN
INTRAMUSCULAR | Status: AC
  Filled 2017-06-04: qty 60

## 2017-06-04 MED ORDER — ONDANSETRON HCL 4 MG/2ML IJ SOLN: 4.0000 mg | Freq: Four times a day (QID) | INTRAMUSCULAR | Status: DC | PRN

## 2017-06-04 SURGICAL SUPPLY — 11 items
BAG SNAP BAND KOVER 36X36 (MISCELLANEOUS) ×2 IMPLANT
CATH JOSEPHSON QUAD-ALLRED 6FR (CATHETERS) ×4 IMPLANT
CATH WEBSTER BI DIR CS D-F CRV (CATHETERS) ×2 IMPLANT
PACK EP LATEX FREE (CUSTOM PROCEDURE TRAY) ×1
PACK EP LF (CUSTOM PROCEDURE TRAY) ×1 IMPLANT
PAD DEFIB LIFELINK (PAD) ×2 IMPLANT
PATCH CARTO3 (PAD) ×2 IMPLANT
SHEATH AVANTI 11CM 6FR (SHEATH) ×4 IMPLANT
SHEATH AVANTI 11CM 8FR (SHEATH) ×2 IMPLANT
SHEATH PINNACLE 7F 10CM (SHEATH) ×2 IMPLANT
SHIELD RADPAD SCOOP 12X17 (MISCELLANEOUS) ×2 IMPLANT

## 2017-06-04 NOTE — H&P (Signed)
Electrophysiology Office Note   Date:  06/04/2017   ID:  Sophia, Scott [birth year], MRN [id number]  PCP:  Jamal Collin, PA-C  Cardiologist:  Dulce Sellar Primary Electrophysiologist:  Regan Lemming, MD    No chief complaint on file.    History of Present Illness: Sophia Scott is a 57 y.o. female who is being seen today for the evaluation of SVT at the request of [name]. Presenting today for electrophysiology evaluation.  She has a history of hypertension, left bundle branch block.  She presented to cardiology clinic with severe palpitations.  She went to the emergency room due to this.  They occur mainly in the evening hours and feel like a forceful rapid heartbeat.  She has shortness of breath but no chest pain.  Heart attack monitor showed episodes of SVT as well as ventricular standstill.  Her rate control has since been stopped.  Today, denies symptoms of palpitations, chest pain, shortness of breath, orthopnea, PND, lower extremity edema, claudication, dizziness, presyncope, syncope, bleeding, or neurologic sequela. The patient is tolerating medications without difficulties.    Past Medical History:  Diagnosis Date  . Essential hypertension 08/30/2014  . Iron deficiency anemia 04/12/2015  . Left bundle branch block (LBBB) 04/05/2017  . Murmur 08/30/2014  . Palpitations 04/05/2017  . Primary insomnia 10/21/2016  . 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 Tendonitis, Achilles, right 05/03/2015  . Work-related stress 04/12/2015   Past Surgical History:  Procedure Laterality Date  . ACHILLES TENDON SURGERY Right   . BREAST REDUCTION SURGERY    . CHOLECYSTECTOMY    . GASTRIC BYPASS    . GASTRIC FUNDOPLICATION       No current facility-administered medications for this encounter.     Allergies:   Chlorphen-pe-acetaminophen   Social History:  The patient  reports that she has never smoked. She has never used smokeless tobacco. She reports that she drinks alcohol.  She reports that she does not use drugs.   Family History:  The patient's family history includes Cancer in her father; Heart disease in her father; Hypertension in her mother.    ROS:  Please see the history of present illness.   Otherwise, review of systems is positive for none.   All other systems are reviewed and negative.   PHYSICAL EXAM: VS:  BP (!) 103/56 (BP Location: Right Arm)   Pulse 77   Temp (!) 97.5 F (36.4 C) (Oral)   Ht  (1.575 m)   Wt 177 lb (80.3 kg)   SpO2 100%   BMI 32.37 kg/m  , BMI Body mass index is 32.37 kg/m. GEN: Well nourished, well developed, in no acute distress  HEENT: normal  Neck: no JVD, carotid bruits, or masses Cardiac: RRR; no murmurs, rubs, or gallops,no edema  Respiratory:  clear to auscultation bilaterally, normal work of breathing GI: soft, nontender, nondistended, + BS MS: no deformity or atrophy  Skin: warm and dry Neuro:  Strength and sensation are intact Psych: euthymic mood, full affect  Recent Labs: 05/14/2017: BUN 8; Creatinine, Ser 0.73; Hemoglobin 9.1; Platelets 334; Potassium 4.4; Sodium 141    Lipid Panel  No results found for: CHOL, TRIG, HDL, CHOLHDL, VLDL, LDLCALC, LDLDIRECT   Wt Readings from Last 3 Encounters:  06/04/17 177 lb (80.3 kg)  05/22/17 181 lb 1.9 oz (82.2 kg)  05/01/17 184 lb (83.5 kg)      Other studies Reviewed: Additional studies/ records that were reviewed today include: TTE 04/13/17  Review  of the above records today demonstrates:  - Left ventricle: The cavity size was normal. Wall thickness was   normal. Systolic function was normal. The estimated ejection   fraction was in the range of 55% to 60%. Wall motion was normal;   there were no regional wall motion abnormalities. There was an   increased relative contribution of atrial contraction to   ventricular filling. Doppler parameters are consistent with high   ventricular filling pressure. - Aortic valve: Valve area (VTI): 1.79 cm^2.  Valve area (Vmax):   1.81 cm^2. Valve area (Vmean): 1.78 cm^2. - Left atrium: The atrium was mildly dilated.  30-day monitor-personally reviewed Sinus rhythm, left bundle branch block, SVT, intermittent AV block  ASSESSMENT AND PLAN:  1.  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 Plan for ablation.  Shaka Zech Spanbauer has presented today for surgery, with the diagnosis of SVT.  The various methods of treatment have been discussed with the patient and family. After consideration of risks, benefits and other options for treatment, the patient has consented to  Procedure(s): SVT ablation as a surgical intervention .  Risks include but not limited to bleeding, tamponade, heart block, stroke, damage to surrounding organs, among others. The patient's history has been reviewed, patient examined, no change in status, stable for surgery.  I have reviewed the patient's chart and labs.  Questions were answered to the patient's satisfaction.    2.  Left bundle branch block: Chronic.  Has a normal ejection fraction.  Continue to monitor.  3.  Intermittent AV block: Occurred while she was on rate controlling medications for her SVT.  These have been stopped.  Plan for SVT ablation.   Signed, Dailon Sheeran [name], MD  06/04/2017 7:06 AM

## 2017-06-04 NOTE — Progress Notes (Addendum)
Site area: Left groin a 6 an 7 french venous sheath was removed  Site Prior to Removal:  Level 0  Pressure Applied For 15 MINUTES    Bedrest Beginning at 1010am  Manual:   Yes.    Patient Status During Pull:  stable  Post Pull Groin Site:  Level 0  Post Pull Instructions Given:  Yes.    Post Pull Pulses Present:  Yes.    Dressing Applied:  Yes.    Comments:  VS remain stable

## 2017-06-04 NOTE — Discharge Instructions (Signed)
**Note Denice Cardon-identified via Obfuscation** Cardiac Ablation, Care After °This sheet gives you information about how to care for yourself after your procedure. Your health care provider may also give you more specific instructions. If you have problems or questions, contact your health care provider. °What can I expect after the procedure? °After the procedure, it is common to have: °· Bruising around your puncture site. °· Tenderness around your puncture site. °· Skipped heartbeats. °· Tiredness (fatigue). ° °Follow these instructions at home: °Puncture site care °· Follow instructions from your health care provider about how to take care of your puncture site. Make sure you: °? Wash your hands with soap and water before you change your bandage (dressing). If soap and water are not available, use hand sanitizer. °? Change your dressing as told by your health care provider. °? Leave stitches (sutures), skin glue, or adhesive strips in place. These skin closures may need to stay in place for up to 2 weeks. If adhesive strip edges start to loosen and curl up, you may trim the loose edges. Do not remove adhesive strips completely unless your health care provider tells you to do that. °· Check your puncture site every day for signs of infection. Check for: °? Redness, swelling, or pain. °? Fluid or blood. If your puncture site starts to bleed, lie down on your back, apply firm pressure to the area, and contact your health care provider. °? Warmth. °? Pus or a bad smell. °Driving °· Ask your health care provider when it is safe for you to drive again after the procedure. °· Do not drive or use heavy machinery while taking prescription pain medicine. °· Do not drive for 24 hours if you were given a medicine to help you relax (sedative) during your procedure. °Activity °· Avoid activities that take a lot of effort for at least 3 days after your procedure. °· Do not lift anything that is heavier than 10 lb (4.5 kg), or the limit that you are told, until your health  care provider says that it is safe. °· Return to your normal activities as told by your health care provider. Ask your health care provider what activities are safe for you. °General instructions °· Take over-the-counter and prescription medicines only as told by your health care provider. °· Do not use any products that contain nicotine or tobacco, such as cigarettes and e-cigarettes. If you need help quitting, ask your health care provider. °· Do not take baths, swim, or use a hot tub until your health care provider approves. °· Do not drink alcohol for 24 hours after your procedure. °· Keep all follow-up visits as told by your health care provider. This is important. °Contact a health care provider if: °· You have redness, mild swelling, or pain around your puncture site. °· You have fluid or blood coming from your puncture site that stops after applying firm pressure to the area. °· Your puncture site feels warm to the touch. °· You have pus or a bad smell coming from your puncture site. °· You have a fever. °· You have chest pain or discomfort that spreads to your neck, jaw, or arm. °· You are sweating a lot. °· You feel nauseous. °· You have a fast or irregular heartbeat. °· You have shortness of breath. °· You are dizzy or light-headed and feel the need to lie down. °· You have pain or numbness in the arm or leg closest to your puncture site. °Get help right away if: °· Your puncture  **Note Amilio Zehnder-identified via Obfuscation** site suddenly swells. °· Your puncture site is bleeding and the bleeding does not stop after applying firm pressure to the area. °These symptoms may represent a serious problem that is an emergency. Do not wait to see if the symptoms will go away. Get medical help right away. Call your local emergency services (911 in the [place] Do not drive yourself to the hospital. °Summary °· After the procedure, it is normal to have bruising and tenderness at the puncture site in your groin, neck, or forearm. °· Check your puncture site every  day for signs of infection. °· Get help right away if your puncture site is bleeding and the bleeding does not stop after applying firm pressure to the area. This is a medical emergency. °This information is not intended to replace advice given to you by your health care provider. Make sure you discuss any questions you have with your health care provider. °Document Released: 05/01/2016 Document Revised: 05/01/2016 Document Reviewed: 05/01/2016 °Elsevier Interactive Patient Education © 2018 [employer] ° °Femoral Site Care °Refer to this sheet in the next few weeks. These instructions provide you with information about caring for yourself after your procedure. Your health care provider may also give you more specific instructions. Your treatment has been planned according to current medical practices, but problems sometimes occur. Call your health care provider if you have any problems or questions after your procedure. °What can I expect after the procedure? °After your procedure, it is typical to have the following: °· Bruising at the site that usually fades within 1-2 weeks. °· Blood collecting in the tissue (hematoma) that may be painful to the touch. It should usually decrease in size and tenderness within 1-2 weeks. ° °Follow these instructions at home: °· Take medicines only as directed by your health care provider. °· You may shower 24-48 hours after the procedure or as directed by your health care provider. Remove the bandage (dressing) and gently wash the site with plain soap and water. Pat the area dry with a clean towel. Do not rub the site, because this may cause bleeding. °· Do not take baths, swim, or use a hot tub until your health care provider approves. °· Check your insertion site every day for redness, swelling, or drainage. °· Do not apply powder or lotion to the site. °· Limit use of stairs to twice a day for the first 2-3 days or as directed by your health care provider. °· Do not squat for  the first 2-3 days or as directed by your health care provider. °· Do not lift over 10 lb (4.5 kg) for 5 days after your procedure or as directed by your health care provider. °· Ask your health care provider when it is okay to: °? Return to work or school. °? Resume usual physical activities or sports. °? Resume sexual activity. °· Do not drive home if you are discharged the same day as the procedure. Have someone else drive you. °· You may drive 24 hours after the procedure unless otherwise instructed by your health care provider. °· Do not operate machinery or power tools for 24 hours after the procedure or as directed by your health care provider. °· If your procedure was done as an outpatient procedure, which means that you went home the same day as your procedure, a responsible adult should be with you for the first 24 hours after you arrive home. °· Keep all follow-up visits as directed by your health care provider. This  **Note Giovoni Bunch-identified via Obfuscation** is important. °Contact a health care provider if: °· You have a fever. °· You have chills. °· You have increased bleeding from the site. Hold pressure on the site. °Get help right away if: °· You have unusual pain at the site. °· You have redness, warmth, or swelling at the site. °· You have drainage (other than a small amount of blood on the dressing) from the site. °· The site is bleeding, and the bleeding does not stop after 30 minutes of holding steady pressure on the site. °· Your leg or foot becomes pale, cool, tingly, or numb. °This information is not intended to replace advice given to you by your health care provider. Make sure you discuss any questions you have with your health care provider. °Document Released: 09/23/2013 Document Revised: 06/28/2015 Document Reviewed: 08/09/2013 °Elsevier Interactive Patient Education © 2018 [employer] ° °

## 2017-06-04 NOTE — Progress Notes (Addendum)
Site area: Right groin a 6 and 8 french venous sheaths were removed  Site Prior to Removal:  Level 0  Pressure Applied For 15 MINUTES    Bedrest Beginning at 1010am  Manual:   Yes.    Patient Status During Pull:  stable  Post Pull Groin Site:  Level 0  Post Pull Instructions Given:  Yes.    Post Pull Pulses Present:  Yes.    Dressing Applied:  Yes.    Comments:  VS remain stable

## 2017-06-05 ENCOUNTER — Encounter (HOSPITAL_COMMUNITY): Payer: Self-pay | Admitting: Cardiology

## 2017-06-24 ENCOUNTER — Ambulatory Visit: Payer: BC Managed Care – PPO | Admitting: Cardiology

## 2017-06-30 ENCOUNTER — Ambulatory Visit: Payer: BC Managed Care – PPO | Admitting: Cardiology

## 2017-07-13 ENCOUNTER — Encounter: Payer: Self-pay | Admitting: Cardiology

## 2017-07-13 ENCOUNTER — Encounter (INDEPENDENT_AMBULATORY_CARE_PROVIDER_SITE_OTHER): Payer: Self-pay

## 2017-07-13 ENCOUNTER — Ambulatory Visit: Payer: BC Managed Care – PPO | Admitting: Cardiology

## 2017-07-13 VITALS — BP 96/68 | HR 72 | Ht 62.0 in | Wt 182.6 lb

## 2017-07-13 DIAGNOSIS — I471 Supraventricular tachycardia: Secondary | ICD-10-CM

## 2017-07-13 DIAGNOSIS — I447 Left bundle-branch block, unspecified: Secondary | ICD-10-CM | POA: Diagnosis not present

## 2017-07-13 NOTE — Progress Notes (Signed)
Electrophysiology Office Note   Date:  07/13/2017   ID:  Sophia Scott, DOB Feb 19, 1960, MRN [id number]  PCP:  Sophia CollinHedgecock, Suzanne, PA-C  Cardiologist:  Sophia Scott Primary Electrophysiologist:  Sophia LemmingWill Scott Emmarae Cowdery, MD    No chief complaint on file.    History of Present Illness: Sophia Scott is a 57 y.o. female who is being seen today for the evaluation of SVT at the request of Sophia Scott. Presenting today for electrophysiology evaluation.  She has a history of hypertension, left bundle branch block.  She presented to cardiology clinic with severe palpitations.  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  Today, denies symptoms of palpitations, chest pain, shortness of breath, orthopnea, PND, lower extremity edema, claudication, dizziness, presyncope, syncope, bleeding, or neurologic sequela. The patient is tolerating medications without difficulties.  Overall she is doing well.  She has had a few episodes of palpitations that have lasted 1 to 2 seconds and has occurred once or twice a week.  She has had no prolonged episodes.   Past Medical History:  Diagnosis Date  . Essential hypertension 08/30/2014  . Iron deficiency anemia 04/12/2015  . Left bundle branch block (LBBB) 04/05/2017  . Murmur 08/30/2014  . Palpitations 04/05/2017  . Primary insomnia 10/21/2016  . 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 Tendonitis, Achilles, right 05/03/2015  . Work-related stress 04/12/2015   Past Surgical History:  Procedure Laterality Date  . ACHILLES TENDON SURGERY Right   . BREAST REDUCTION SURGERY    . CHOLECYSTECTOMY    . GASTRIC BYPASS    . GASTRIC FUNDOPLICATION    . SVT ABLATION N/A 06/04/2017   Procedure: SVT ABLATION;  Surgeon: Sophia Lemmingamnitz, Sophia Colter Martin, MD;  [facility] INV[facility] CV LAB;  Service: Cardiovascular;  Laterality: N/A;     Current Outpatient Medications  Medication Sig Dispense Refill  . clonazePAM (KLONOPIN) 1 MG tablet TAKE 1/2 TO 1 (ONE-HALF TO ONE)  TABLET BY MOUTH UP TO TWICE DAILY AS NEEDED FOR ANXIETY    . olmesartan (BENICAR) 40 MG tablet Take 40 mg by mouth daily.    . ondansetron (ZOFRAN) 4 MG tablet Take 4 mg by mouth every 8 (eight) hours as needed for nausea.     . sertraline (ZOLOFT) 100 MG tablet Take 100 mg by mouth daily.    . traZODone (DESYREL) 50 MG tablet Take 50 mg by mouth at bedtime.      No current facility-administered medications for this visit.     Allergies:   Chlorphen-pe-acetaminophen   Social History:  The patient  reports that she has never smoked. She has never used smokeless tobacco. She reports that she drinks alcohol. She reports that she does not use drugs.   Family History:  The patient's family history includes Cancer in her father; Heart disease in her father; Hypertension in her mother.    ROS:  Please see the history of present illness.   Otherwise, review of systems is positive for palpitations.   All other systems are reviewed and negative.   PHYSICAL EXAM: VS:  BP 96/68   Pulse 72   Ht 5\' 2"  (1.575 m)   Wt 182 lb 9.6 oz (82.8 kg)   BMI 33.40 kg/m  , BMI Body mass index is 33.4 kg/m. GEN: Well nourished, well developed, in no acute distress  HEENT: normal  Neck: no JVD, carotid bruits, or masses Cardiac: RRR; no murmurs, rubs, or gallops,no edema  Respiratory:  clear to auscultation  bilaterally, normal work of breathing GI: soft, nontender, nondistended, + BS MS: no deformity or atrophy  Skin: warm and dry Neuro:  Strength and sensation are intact Psych: euthymic mood, full affect  EKG:  EKG is ordered today. Personal review of the ekg ordered shows SR, LBBB, rate 72    Recent Labs: 05/14/2017: BUN 8; Creatinine, Ser 0.73; Hemoglobin 9.1; Platelets 334; Potassium 4.4; Sodium 141    Lipid Panel  No results found for: CHOL, TRIG, HDL, CHOLHDL, VLDL, LDLCALC, LDLDIRECT   Wt Readings from Last 3 Encounters:  07/13/17 182 lb 9.6 oz (82.8 kg)  06/04/17 177 lb (80.3 kg)    05/22/17 181 lb 1.9 oz (82.2 kg)      Other studies Reviewed: Additional studies/ records that were reviewed today include: TTE 04/13/17  Review of the above records today demonstrates:  - Left ventricle: The cavity size was normal. Wall thickness was   normal. Systolic function was normal. The estimated ejection   fraction was in the range of 55% to 60%. Wall motion was normal;   there were no regional wall motion abnormalities. There was an   increased relative contribution of atrial contraction to   ventricular filling. Doppler parameters are consistent with high   ventricular filling pressure. - Aortic valve: Valve area (VTI): 1.79 cm^2. Valve area (Vmax):   1.81 cm^2. Valve area (Vmean): 1.78 cm^2. - Left atrium: The atrium was mildly dilated.  30-day monitor-personally reviewed Sinus rhythm, left bundle branch block, SVT, intermittent AV block  ASSESSMENT AND PLAN:  1.  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  She is doing well without major issues.  She does have a few seconds of potation's, but nothing worrisome.  No changes.  2.  Left bundle branch block: Chronic with a normal ejection fraction.  No changes.  3.  Intermittent AV block: Occurred while on rate controlling medications for SVT.  Current medicines are reviewed at length with the patient today.   The patient does not have concerns regarding her medicines.  The following changes were made today: None  Labs/ tests ordered today include:  Orders Placed This Encounter  Procedures  . EKG 12-Lead     Disposition:   FU with Sophia Scott as needed weeks  Signed, Sophia Moise [name], MD  07/13/2017 2:42 PM     [facility] [address] [place] [address] [phone] (office) [phone] (fax)

## 2017-07-13 NOTE — Patient Instructions (Signed)
Medication Instructions:  Your physician recommends that you continue on your current medications as directed. Please refer to the Current Medication list given to you today.  Labwork: None ordered     *We will only notify you of abnormal results, otherwise continue current treatment plan.  Testing/Procedures: None ordered  Follow-Up: No follow up is needed at this time with Dr. Camnitz.  He will see you on an as needed basis.   * If you need a refill on your cardiac medications before your next appointment, please call your pharmacy.   *Please note that any paperwork needing to be filled out by the provider will need to be addressed at the front desk prior to seeing the provider. Please note that any FMLA, disability or other documents regarding health condition is subject to a $25.00 charge that must be received prior to completion of paperwork in the form of a money order or check.  Thank you for choosing [facility]!!   Sherri Price, RN [phone]      \ 

## 2017-09-29 DIAGNOSIS — F419 Anxiety disorder, unspecified: Secondary | ICD-10-CM | POA: Insufficient documentation

## 2018-02-07 ENCOUNTER — Other Ambulatory Visit: Payer: Self-pay

## 2018-02-07 ENCOUNTER — Emergency Department (HOSPITAL_BASED_OUTPATIENT_CLINIC_OR_DEPARTMENT_OTHER)
Admission: EM | Admit: 2018-02-07 | Discharge: 2018-02-07 | Disposition: A | Discharge status: Home / Self Care | Payer: BC Managed Care – PPO | Attending: Emergency Medicine | Admitting: Emergency Medicine

## 2018-02-07 ENCOUNTER — Encounter (HOSPITAL_BASED_OUTPATIENT_CLINIC_OR_DEPARTMENT_OTHER): Payer: Self-pay | Admitting: Emergency Medicine

## 2018-02-07 ENCOUNTER — Emergency Department (HOSPITAL_BASED_OUTPATIENT_CLINIC_OR_DEPARTMENT_OTHER): Payer: BC Managed Care – PPO

## 2018-02-07 DIAGNOSIS — K0889 Other specified disorders of teeth and supporting structures: Secondary | ICD-10-CM | POA: Diagnosis present

## 2018-02-07 DIAGNOSIS — K1120 Sialoadenitis, unspecified: Secondary | ICD-10-CM | POA: Diagnosis not present

## 2018-02-07 DIAGNOSIS — Z79899 Other long term (current) drug therapy: Secondary | ICD-10-CM | POA: Insufficient documentation

## 2018-02-07 DIAGNOSIS — I10 Essential (primary) hypertension: Secondary | ICD-10-CM | POA: Insufficient documentation

## 2018-02-07 DIAGNOSIS — L0211 Cutaneous abscess of neck: Secondary | ICD-10-CM | POA: Insufficient documentation

## 2018-02-07 LAB — BASIC METABOLIC PANEL
Anion gap: 8 (ref 5–15)
BUN: 12 mg/dL (ref 6–20)
CO2: 27 mmol/L (ref 22–32)
CREATININE: 0.55 mg/dL (ref 0.44–1.00)
Calcium: 9 mg/dL (ref 8.9–10.3)
Chloride: 100 mmol/L (ref 98–111)
GFR calc Af Amer: >60 mL/min (ref >60)
GLUCOSE: 110 mg/dL — AB (ref 70–99)
Potassium: 3.4 mmol/L — ABNORMAL LOW (ref 3.5–5.1)
Sodium: 135 mmol/L (ref 135–145)

## 2018-02-07 LAB — CBC WITH DIFFERENTIAL/PLATELET
Abs Immature Granulocytes: 0.05 10*3/uL (ref 0.00–0.07)
BASOS PCT: 0 %
Basophils Absolute: 0 10*3/uL (ref 0.0–0.1)
EOS ABS: 0 10*3/uL (ref 0.0–0.5)
EOS PCT: 0 %
HEMATOCRIT: 36.8 % (ref 36.0–46.0)
Hemoglobin: 10.7 g/dL — ABNORMAL LOW (ref 12.0–15.0)
Immature Granulocytes: 1 %
Lymphocytes Relative: 8 %
Lymphs Abs: 0.8 10*3/uL (ref 0.7–4.0)
MCH: 23.7 pg — AB (ref 26.0–34.0)
MCHC: 29.1 g/dL — ABNORMAL LOW (ref 30.0–36.0)
MCV: 81.6 fL (ref 80.0–100.0)
MONO ABS: 0.7 10*3/uL (ref 0.1–1.0)
MONOS PCT: 7 %
NEUTROS PCT: 84 %
Neutro Abs: 8.5 10*3/uL — ABNORMAL HIGH (ref 1.7–7.7)
PLATELETS: 328 10*3/uL (ref 150–400)
RBC: 4.51 MIL/uL (ref 3.87–5.11)
RDW: 15.4 % (ref 11.5–15.5)
WBC: 10.1 10*3/uL (ref 4.0–10.5)
nRBC: 0 % (ref 0.0–0.2)

## 2018-02-07 MED ORDER — CLINDAMYCIN HCL 300 MG PO CAPS: 300.0000 mg | ORAL_CAPSULE | Freq: Four times a day (QID) | ORAL | 0 refills | Status: DC

## 2018-02-07 MED ORDER — CLINDAMYCIN PHOSPHATE 600 MG/50ML IV SOLN
600.0000 mg | Freq: Once | INTRAVENOUS | Status: AC
  Administered 2018-02-07: 600 mg via INTRAVENOUS
  Filled 2018-02-07: qty 50

## 2018-02-07 MED ORDER — FENTANYL CITRATE (PF) 100 MCG/2ML IJ SOLN
50.0000 ug | Freq: Once | INTRAMUSCULAR | Status: AC
  Administered 2018-02-07: 50 ug via INTRAVENOUS
  Filled 2018-02-07: qty 2

## 2018-02-07 MED ORDER — HYDROCODONE-ACETAMINOPHEN 5-325 MG PO TABS: 1.0000 | ORAL_TABLET | ORAL | 0 refills | Status: DC | PRN

## 2018-02-07 MED ORDER — IOPAMIDOL (ISOVUE-300) INJECTION 61%
75.0000 mL | Freq: Once | INTRAVENOUS | Status: AC | PRN
  Administered 2018-02-07: 75 mL via INTRAVENOUS

## 2018-02-07 NOTE — ED Notes (Signed)
Assessed patient at triage. No distress noted. BBS clear. SAT 100%. No Stridor. Noted swelling, but not interfering with her airway

## 2018-02-07 NOTE — ED Triage Notes (Addendum)
Swelling from a abscess tooth last night, this am has sore throat and trouble breathing. Anxious in triage . Swelling noted to left side of neck, worse this am . Took 3 doses of Clindamycin from old prescription after speaking to dental office

## 2018-02-07 NOTE — ED Provider Notes (Signed)
[facility] EMERGENCY DEPARTMENT Provider Note   CSN: [id number] Arrival date & time: 02/07/18  1157     History   Chief Complaint Chief Complaint  Patient presents with  . Dental Pain    neck swelling     HPI Sophia Scott is a 58 y.o. female.  Patient is a 58 year old female who presents with left-sided neck swelling.  She is had some issues with her left bottom back molar for about a week.  Initially was sensitive to cold.  Then it started hurting over the last 2 days.  She did note some swelling under her jaw yesterday and got markedly worse today.  She feels like she is having some trouble swallowing and it hurts to breathe.  She had some clindamycin at home and she is taken 3 doses so far which she was instructed to by her dental office.  She denies any known fevers.     Past Medical History:  Diagnosis Date  . Essential hypertension 08/30/2014  . Iron deficiency anemia 04/12/2015  . Left bundle branch block (LBBB) 04/05/2017  . Murmur 08/30/2014  . Palpitations 04/05/2017  . Primary insomnia 10/21/2016  . 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 Tendonitis, Achilles, right 05/03/2015  . Work-related stress 04/12/2015    Patient Active Problem List   Diagnosis Date Noted  . SVT (supraventricular tachycardia) (HCC) 05/22/2017  . 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 PVC's (premature ventricular contractions) 04/09/2017  . Left bundle branch block (LBBB) 04/05/2017  . Palpitation 04/05/2017  . Primary insomnia 10/21/2016  . 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 Tendonitis, Achilles, right 05/03/2015  . Iron deficiency anemia 04/12/2015  . Work-related stress 04/12/2015  . Essential hypertension 08/30/2014  . Murmur 08/30/2014    Past Surgical History:  Procedure Laterality Date  . ACHILLES TENDON SURGERY Right   . BREAST REDUCTION SURGERY    . CHOLECYSTECTOMY    . GASTRIC BYPASS    . GASTRIC FUNDOPLICATION    . SVT ABLATION N/A 06/04/2017   Procedure: SVT ABLATION;  Surgeon: Regan Lemming, MD;  Location: [facility] INVASIVE CV LAB;  Service: Cardiovascular;  Laterality: N/A;     OB History   No obstetric history on file.      Home Medications    Prior to Admission medications   Medication Sig Start Date End Date Taking? Authorizing Provider  clonazePAM (KLONOPIN) 1 MG tablet TAKE 1/2 TO 1 (ONE-HALF TO ONE) TABLET BY MOUTH UP TO TWICE DAILY AS NEEDED FOR ANXIETY 06/14/15  Yes [provider]  ondansetron (ZOFRAN) 4 MG tablet Take 4 mg by mouth every 8 (eight) hours as needed for nausea.  04/11/17  Yes [provider]  traZODone (DESYREL) 50 MG tablet Take 50 mg by mouth at bedtime.  02/10/17  Yes [provider]  clindamycin (CLEOCIN) 300 MG capsule Take 1 capsule (300 mg total) by mouth 4 (four) times daily. X 7 days 02/07/18   Rolan Bucco, MD  HYDROcodone-acetaminophen (NORCO/VICODIN) 5-325 MG tablet Take 1-2 tablets by mouth every 4 (four) hours as needed. 02/07/18   Rolan Bucco, MD  olmesartan (BENICAR) 40 MG tablet Take 40 mg by mouth daily.    [provider]  sertraline (ZOLOFT) 100 MG tablet Take 100 mg by mouth daily. 11/30/14   [provider]    Family History Family History  Problem Relation Age of Onset  . Cancer Father   . Heart disease Father   . Hypertension Mother  Social History Social History   Tobacco Use  . Smoking status: Never Smoker  . Smokeless tobacco: Never Used  Substance Use Topics  . Alcohol use: Yes    Frequency: Never    Comment: occassionaly  . Drug use: No     Allergies   Chlorphen-pe-acetaminophen   Review of Systems Review of Systems  Constitutional: Negative for chills, diaphoresis, fatigue and fever.  HENT: Positive for sore throat and trouble swallowing. Negative for congestion, rhinorrhea, sneezing and voice change.        Toothache, neck swelling  Eyes: Negative.   Respiratory: Negative for cough, chest tightness and  shortness of breath.   Cardiovascular: Negative for chest pain and leg swelling.  Gastrointestinal: Negative for abdominal pain, blood in stool, diarrhea, nausea and vomiting.  Genitourinary: Negative for difficulty urinating, flank pain, frequency and hematuria.  Musculoskeletal: Negative for arthralgias and back pain.  Skin: Negative for rash.  Neurological: Negative for dizziness, speech difficulty, weakness, numbness and headaches.     Physical Exam Updated Vital Signs BP (!) 183/90 (BP Location: Left Arm)   Pulse 79   Temp 98.4 F (36.9 C) (Oral)   Resp 18   Ht 5\' 2"  (1.575 m)   Wt 78.9 kg   SpO2 100%   BMI 31.83 kg/m   Physical Exam Constitutional:      Appearance: She is well-developed.  HENT:     Head: Normocephalic and atraumatic.     Mouth/Throat:     Comments: Moderate swelling to the submandibular area.  There is a decayed tooth involving the left back molar but there is no palpable adjacent abscess, and mild limitation of full mouth opening Eyes:     Pupils: Pupils are equal, round, and reactive to light.  Neck:     Musculoskeletal: Normal range of motion and neck supple.     Comments: Positive moderate swelling to the left side of the neck in the submandibular area. Cardiovascular:     Rate and Rhythm: Normal rate and regular rhythm.     Heart sounds: Normal heart sounds.  Pulmonary:     Effort: Pulmonary effort is normal. No respiratory distress.     Breath sounds: Normal breath sounds. No wheezing or rales.  Chest:     Chest wall: No tenderness.  Abdominal:     General: Bowel sounds are normal.     Palpations: Abdomen is soft.     Tenderness: There is no abdominal tenderness. There is no guarding or rebound.  Musculoskeletal: Normal range of motion.  Lymphadenopathy:     Cervical: No cervical adenopathy.  Skin:    General: Skin is warm and dry.     Findings: No rash.  Neurological:     Mental Status: She is alert and oriented to person, place, and  time.      ED Treatments / Results  Labs (all labs ordered are listed, but only abnormal results are displayed) Labs Reviewed  BASIC METABOLIC PANEL - Abnormal; Notable for the following components:      Result Value   Potassium 3.4 (*)    Glucose, Bld 110 (*)    All other components within normal limits  CBC WITH DIFFERENTIAL/PLATELET - Abnormal; Notable for the following components:   Hemoglobin 10.7 (*)    MCH 23.7 (*)    MCHC 29.1 (*)    Neutro Abs 8.5 (*)    All other components within normal limits    EKG None  Radiology Ct Soft Tissue Neck  W Contrast  Result Date: 02/07/2018 CLINICAL DATA:  58 y/o  F; swelling on the left-sided neck. EXAM: CT NECK WITH CONTRAST TECHNIQUE: Multidetector CT imaging of the neck was performed using the standard protocol following the bolus administration of intravenous contrast. CONTRAST:  33mL ISOVUE-300 IOPAMIDOL (ISOVUE-300) INJECTION 61% COMPARISON:  None. FINDINGS: Pharynx and larynx: Inflammation within the left face and neck exert mild mass effect on the left aspect of the oropharynx and there is mild reactive mucosal thickening. No exophytic mass. Salivary glands: Inflammation and swelling of the left submandibular gland. Normal size and appearance of the parotid glands and right submandibular gland. Sublingual glands are obscured by streak artifact from oral hardware. Thyroid: Normal. Lymph nodes: Left upper cervical and submandibular lymphadenopathy, likely reactive. Vascular: Negative. Limited intracranial: Negative. Visualized orbits: Negative. Mastoids and visualized paranasal sinuses: Clear. Skeleton: Mild-to-moderate spondylosis of the cervical spine with predominantly discogenic degenerative changes. No high-grade bony spinal canal stenosis. Upper chest: Negative. Other: Inflammation is present within the left sublingual, left submandibular, left parapharyngeal, left anterior cervical triangle, and superficial left anterior neck soft  tissues. No discrete abscess identified. IMPRESSION: Inflammation centered at the left submandibular gland favored to represent acute sialadenitis. No obstructing submandibular gland duct stone identified, however, the anterior oropharynx is obscured by streak artifact from dental hardware. Surrounding inflammation in the left sublingual, left submandibular, left parapharyngeal, left anterior cervical triangle, and superficial left anterior neck soft tissues. No abscess identified. Electronically Signed   By: Mitzi Hansen M.D.   On: 02/07/2018 13:54    Procedures Procedures (including critical care time)  Medications Ordered in ED Medications  clindamycin (CLEOCIN) IVPB 600 mg (0 mg Intravenous Stopped 02/07/18 1320)  fentaNYL (SUBLIMAZE) injection 50 mcg (50 mcg Intravenous Given 02/07/18 1307)  iopamidol (ISOVUE-300) 61 % injection 75 mL (75 mLs Intravenous Contrast Given 02/07/18 1328)  fentaNYL (SUBLIMAZE) injection 50 mcg (50 mcg Intravenous Given 02/07/18 1405)     Initial Impression / Assessment and Plan / ED Course  I have reviewed the triage vital signs and the nursing notes.  Pertinent labs & imaging results that were available during my care of the patient were reviewed by me and considered in my medical decision making (see chart for details).     Patient is a 58 year old female who presents with swelling under her left submandibular area.  It does not seem to communicate with the tooth that is bothering her.  CT scan was performed shows evidence of acute sialoadenitis.  There is no visualized stone.  Not able to express any purulent material.  She was treated with antibiotics and pain medication in the ED.  She has no airway compromise.  She was discharged home in good condition.  She was encouraged to massage the area and use warm compresses.  She was given a prescription for clindamycin and a short course of hydrocodone.  She was encouraged to follow-up with her PCP tomorrow  for recheck.  Return precautions were given.  Final Clinical Impressions(s) / ED Diagnoses   Final diagnoses:  Sialadenitis    ED Discharge Orders         Ordered    clindamycin (CLEOCIN) 300 MG capsule  4 times daily     02/07/18 1421    HYDROcodone-acetaminophen (NORCO/VICODIN) 5-325 MG tablet  Every 4 hours PRN     02/07/18 1421           Rolan Bucco, MD 02/07/18 1422

## 2018-02-11 DIAGNOSIS — K1120 Sialoadenitis, unspecified: Secondary | ICD-10-CM | POA: Insufficient documentation

## 2018-03-30 DIAGNOSIS — Z9049 Acquired absence of other specified parts of digestive tract: Secondary | ICD-10-CM | POA: Insufficient documentation

## 2018-11-03 DIAGNOSIS — D649 Anemia, unspecified: Secondary | ICD-10-CM | POA: Insufficient documentation

## 2018-12-03 DIAGNOSIS — R404 Transient alteration of awareness: Secondary | ICD-10-CM | POA: Insufficient documentation

## 2018-12-03 DIAGNOSIS — R61 Generalized hyperhidrosis: Secondary | ICD-10-CM | POA: Insufficient documentation

## 2018-12-03 DIAGNOSIS — M7918 Myalgia, other site: Secondary | ICD-10-CM | POA: Insufficient documentation

## 2019-03-29 DIAGNOSIS — R4789 Other speech disturbances: Secondary | ICD-10-CM | POA: Insufficient documentation

## 2019-06-06 DIAGNOSIS — M5412 Radiculopathy, cervical region: Secondary | ICD-10-CM | POA: Insufficient documentation

## 2020-01-20 DIAGNOSIS — E538 Deficiency of other specified B group vitamins: Secondary | ICD-10-CM | POA: Insufficient documentation

## 2020-09-19 ENCOUNTER — Encounter (HOSPITAL_BASED_OUTPATIENT_CLINIC_OR_DEPARTMENT_OTHER): Payer: Self-pay | Admitting: Emergency Medicine

## 2020-09-19 ENCOUNTER — Emergency Department (HOSPITAL_BASED_OUTPATIENT_CLINIC_OR_DEPARTMENT_OTHER)
Admission: EM | Admit: 2020-09-19 | Discharge: 2020-09-19 | Disposition: A | Discharge status: Home / Self Care | Payer: BC Managed Care – PPO | Attending: Emergency Medicine | Admitting: Emergency Medicine

## 2020-09-19 ENCOUNTER — Other Ambulatory Visit: Payer: Self-pay

## 2020-09-19 ENCOUNTER — Emergency Department (HOSPITAL_BASED_OUTPATIENT_CLINIC_OR_DEPARTMENT_OTHER): Payer: BC Managed Care – PPO

## 2020-09-19 DIAGNOSIS — R5383 Other fatigue: Secondary | ICD-10-CM | POA: Diagnosis not present

## 2020-09-19 DIAGNOSIS — R0602 Shortness of breath: Secondary | ICD-10-CM | POA: Insufficient documentation

## 2020-09-19 DIAGNOSIS — I10 Essential (primary) hypertension: Secondary | ICD-10-CM | POA: Insufficient documentation

## 2020-09-19 DIAGNOSIS — R072 Precordial pain: Secondary | ICD-10-CM | POA: Insufficient documentation

## 2020-09-19 DIAGNOSIS — Z79899 Other long term (current) drug therapy: Secondary | ICD-10-CM | POA: Diagnosis not present

## 2020-09-19 DIAGNOSIS — R112 Nausea with vomiting, unspecified: Secondary | ICD-10-CM | POA: Insufficient documentation

## 2020-09-19 DIAGNOSIS — F419 Anxiety disorder, unspecified: Secondary | ICD-10-CM | POA: Diagnosis not present

## 2020-09-19 DIAGNOSIS — E119 Type 2 diabetes mellitus without complications: Secondary | ICD-10-CM | POA: Diagnosis not present

## 2020-09-19 DIAGNOSIS — R079 Chest pain, unspecified: Secondary | ICD-10-CM

## 2020-09-19 DIAGNOSIS — R61 Generalized hyperhidrosis: Secondary | ICD-10-CM | POA: Diagnosis not present

## 2020-09-19 LAB — BASIC METABOLIC PANEL
Anion gap: 9 (ref 5–15)
BUN: 11 mg/dL (ref 6–20)
CO2: 27 mmol/L (ref 22–32)
Calcium: 9.2 mg/dL (ref 8.9–10.3)
Chloride: 101 mmol/L (ref 98–111)
Creatinine, Ser: 0.66 mg/dL (ref 0.44–1.00)
GFR, Estimated: >60 mL/min (ref >60)
Glucose, Bld: 98 mg/dL (ref 70–99)
Potassium: 3.5 mmol/L (ref 3.5–5.1)
Sodium: 137 mmol/L (ref 135–145)

## 2020-09-19 LAB — CBC
HCT: 43.4 % (ref 36.0–46.0)
Hemoglobin: 14.3 g/dL (ref 12.0–15.0)
MCH: 30.5 pg (ref 26.0–34.0)
MCHC: 32.9 g/dL (ref 30.0–36.0)
MCV: 92.5 fL (ref 80.0–100.0)
Platelets: 317 10*3/uL (ref 150–400)
RBC: 4.69 MIL/uL (ref 3.87–5.11)
RDW: 12.9 % (ref 11.5–15.5)
WBC: 6 10*3/uL (ref 4.0–10.5)
nRBC: 0 % (ref 0.0–0.2)

## 2020-09-19 LAB — TROPONIN I (HIGH SENSITIVITY)
Troponin I (High Sensitivity): 6 ng/L (ref <18)
Troponin I (High Sensitivity): 5 ng/L (ref <18)

## 2020-09-19 LAB — D-DIMER, QUANTITATIVE: D-Dimer, Quant: 0.48 ug/mL-FEU (ref 0.00–0.50)

## 2020-09-19 MED ORDER — HYDROXYZINE HCL 25 MG PO TABS: 25.0000 mg | ORAL_TABLET | Freq: Four times a day (QID) | ORAL | 0 refills | Status: DC | PRN

## 2020-09-19 MED ORDER — ASPIRIN 81 MG PO CHEW
324.0000 mg | CHEWABLE_TABLET | Freq: Once | ORAL | Status: AC
  Administered 2020-09-19: 324 mg via ORAL
  Filled 2020-09-19: qty 4

## 2020-09-19 NOTE — Discharge Instructions (Addendum)
Cardiology should be calling you to schedule an appointment for further evaluation of your chest pains  I would also recommend following up with your PCP regarding the stress you have been experiencing lately.   Return to the ED IMMEDIATELY for any new/worsening symptoms

## 2020-09-19 NOTE — ED Provider Notes (Signed)
[facility] EMERGENCY DEPARTMENT Provider Note   CSN: [id number] Arrival date & time: 09/19/20  1023     History Chief Complaint  Patient presents with   Chest Pain    Sophia Scott is a 60 y.o. female with PMHx HTN, anemia, LBBB, and heart murmur who presents to the ED today with complaint of gradual onset, constant, waxing and waning, substernal chest pain that began 4 days ago.  Patient reports she was at church sitting down when she began having some substernal chest pain.  She also reports increased fatigue at that time and difficulty staying awake which she thought was abnormal for her.  She states that Monday her chest pain became worse.  She does report that she was also diaphoretic Monday with nausea and nonbloody nonbilious emesis.  She states she took 1 to 325 mg aspirin at that time without much relief.  Tuesday she took another 324 mg aspirin again without relief.  She does report that now she is having pain between her shoulder blades as well.  She describes her chest pain and back pain as pleuritic in nature.  She also complains of some shortness of breath.  She does report that her father had a CABG in his 5250s or 2160s however she is unsure.  He did pass away from CHF.  Patient denies any leg swelling or abdominal distention.  Patient is a never smoker.  She has never had pain like this in the past.   The history is provided by the patient and medical records.   HPI: A 60 year old patient with a history of hypertension and obesity presents for evaluation of chest pain. Initial onset of pain was more than 6 hours ago. The patient's chest pain is sharp and is not worse with exertion. The patient complains of nausea and reports some diaphoresis. The patient's chest pain is middle- or left-sided, is not well-localized, is not described as heaviness/pressure/tightness and does not radiate to the arms/jaw/neck. The patient has a family history of coronary artery disease in a  first-degree relative with onset less than age 58. The patient has no history of stroke, has no history of peripheral artery disease, has not smoked in the past 90 days, denies any history of treated diabetes and has no history of hypercholesterolemia.   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   PVC's (premature ventricular contractions) 04/09/2017   Left bundle branch block (LBBB) 04/05/2017   Palpitation 04/05/2017   Primary insomnia 10/21/2016   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     Family History  Problem Relation Age of Onset   Cancer Father    Heart disease Father    Hypertension Mother     Social History   Tobacco Use   Smoking status: Never   Smokeless tobacco: Never  Vaping Use   Vaping Use: Never used  Substance Use Topics   Alcohol use: Yes    Comment: occassionaly   Drug use: No    Home Medications Prior to Admission medications   Medication Sig  Start Date End Date Taking? Authorizing Provider  amLODipine (NORVASC) 5 MG tablet Take 5 mg by mouth daily.   Yes [provider]  clonazePAM (KLONOPIN) 1 MG tablet TAKE 1/2 TO 1 (ONE-HALF TO ONE) TABLET BY MOUTH UP TO TWICE DAILY AS NEEDED FOR ANXIETY 06/14/15  Yes [provider]  hydrOXYzine (ATARAX/VISTARIL) 25 MG tablet Take 1 tablet (25 mg total) by mouth every 6 (six) hours as needed. 09/19/20  Yes Isaac Lacson, PA-C  sertraline (ZOLOFT) 100 MG tablet Take 100 mg by mouth daily. 11/30/14  Yes [provider]  traZODone (DESYREL) 50 MG tablet Take 50 mg by mouth at bedtime.  02/10/17  Yes [provider]  vitamin B-12 (CYANOCOBALAMIN) 1000 MCG tablet Take 1,000 mcg by mouth daily.   Yes [provider]  clindamycin (CLEOCIN) 300 MG capsule Take 1 capsule (300 mg total) by mouth 4 (four) times daily. X 7 days 02/07/18   Rolan Bucco, MD  HYDROcodone-acetaminophen (NORCO/VICODIN) 5-325 MG tablet Take 1-2 tablets by mouth every 4 (four) hours as needed. 02/07/18   Rolan Bucco, MD  olmesartan (BENICAR) 40 MG tablet Take 40 mg by mouth daily.    [provider]  ondansetron (ZOFRAN) 4 MG tablet Take 4 mg by mouth every 8 (eight) hours as needed for nausea.  04/11/17   [provider]    Allergies    Chlorphen-pe-acetaminophen  Review of Systems   Review of Systems  Constitutional:  Positive for fatigue. Negative for chills and fever.  Respiratory:  Positive for shortness of breath. Negative for cough.   Cardiovascular:  Positive for chest pain. Negative for palpitations and leg swelling.  Gastrointestinal:  Positive for nausea and vomiting. Negative for diarrhea.  Musculoskeletal:  Positive for back pain.  All other systems reviewed and are negative.  Physical Exam Updated Vital Signs BP (!) 151/90 (BP Location: Left Arm)   Pulse 74   Temp 98.1 F (36.7 C) (Oral)   Resp 18   Ht 5\' 2"  (1.575 m)   Wt 89.8 kg   SpO2 99%    BMI 36.21 kg/m   Physical Exam Vitals and nursing note reviewed.  Constitutional:      Appearance: She is not ill-appearing or diaphoretic.  HENT:     Head: Normocephalic and atraumatic.  Eyes:     Conjunctiva/sclera: Conjunctivae normal.  Cardiovascular:     Rate and Rhythm: Normal rate and regular rhythm.     Pulses:          Radial pulses are 2+ on the right side and 2+ on the left side.  Pulmonary:     Effort: Pulmonary effort is normal.     Breath sounds: Normal breath sounds. No decreased breath sounds, wheezing, rhonchi or rales.  Chest:     Chest wall: No tenderness.  Abdominal:     Palpations: Abdomen is soft.  Tenderness: There is no abdominal tenderness. There is no guarding or rebound.  Musculoskeletal:     Cervical back: Neck supple.     Right lower leg: No edema.     Left lower leg: No edema.  Skin:    General: Skin is warm and dry.  Neurological:     Mental Status: She is alert.    ED Results / Procedures / Treatments   Labs (all labs ordered are listed, but only abnormal results are displayed) Labs Reviewed  BASIC METABOLIC PANEL  CBC  D-DIMER, QUANTITATIVE  TROPONIN I (HIGH SENSITIVITY)  TROPONIN I (HIGH SENSITIVITY)    EKG EKG Interpretation  Date/Time:  Wednesday September 19 2020 10:31:44 EDT Ventricular Rate:  71 PR Interval:  172 QRS Duration: 128 QT Interval:  412 QTC Calculation: 447 R Axis:   -8 Text Interpretation: Normal sinus rhythm Left bundle branch block Abnormal ECG No old tracing to compare Confirmed by Pricilla Loveless [phone]) on 09/19/2020 10:45:47 AM  Radiology DG Chest 2 View  Result Date: 09/19/2020 CLINICAL DATA:  Chest pain EXAM: CHEST - 2 VIEW COMPARISON:  None available FINDINGS: The heart size and mediastinal contours are within normal limits. Both lungs are clear. The visualized skeletal structures are unremarkable. Postoperative bilateral upper quadrants. IMPRESSION: No active cardiopulmonary disease. Electronically  Signed   By: Marnee Spring M.D.   On: 09/19/2020 11:29    Procedures Procedures   Medications Ordered in ED Medications  aspirin chewable tablet 324 mg (324 mg Oral Given 09/19/20 1222)    ED Course  I have reviewed the triage vital signs and the nursing notes.  Pertinent labs & imaging results that were available during my care of the patient were reviewed by me and considered in my medical decision making (see chart for details).    MDM Rules/Calculators/A&P HEAR Score: 2                         60 year old female who presents to the ED today complaining of constant substernal chest pain as well as back pain, pleuritic in nature for the past 4 days.  Also complains of diaphoresis, nausea, vomiting without history of same.  On arrival to the ED vitals are stable, blood pressure slightly elevated at 151/90.  EKG does show unchanged left bundle branch block history of same.  Chest x-ray clear.  On my exam patient is overall comfortable appearing.  She is nondiaphoretic at this time.  She has equal radial pulses.  We will plan for ACS work-up.  Given pleuritic chest pain we will add on D-dimer at this time.  Story does sound somewhat concerning for ACS today.  324 mg aspirin provided.   CBC without leukocytosis. Hgb stable at 14.3 BMP without electrolyte abnormalities Troponin 6 D dimer negative at 0.48. Low wells risk for PE.  Will plan for repeat troponin at this time and likely consult to cardiology for close outpatient follow up.   Repeat troponin downtrending at 5.  Discussed case with Dr. Eldridge Dace with Cardiology who will plan for cards coordinator Trish to set up a close outpatient follow up with Dr. Dulce Sellar pt's cardiologist. Given stable troponins do not feel pt requires admission at this time. Stable for discharge. Pt is requesting something for anxiety - she reports she has been under a lot of stress recently. Will prescribe hydroxyzine however advised she discuss with her  PCP  This note was prepared using Conservation officer, historic buildings and  may include unintentional dictation errors due to the inherent limitations of voice recognition software.   Final Clinical Impression(s) / ED Diagnoses Final diagnoses:  Nonspecific chest pain  Anxiety    Rx / DC Orders ED Discharge Orders          Ordered    hydrOXYzine (ATARAX/VISTARIL) 25 MG tablet  Every 6 hours PRN        09/19/20 1554             Discharge Instructions      Cardiology should be calling you to schedule an appointment for further evaluation of your chest pains  I would also recommend following up with your PCP regarding the stress you have been experiencing lately.   Return to the ED IMMEDIATELY for any new/worsening symptoms       Tanda Rockers, PA-C 09/19/20 1555    Pricilla Loveless, MD 09/22/20 [phone]

## 2020-09-19 NOTE — ED Notes (Signed)
Pt escorted to discharge window. Verbalized understanding discharge instructions. In no acute distress.   

## 2020-09-19 NOTE — ED Triage Notes (Addendum)
Pt reports left sided chest pain with radiation to back since Sunday. Pt reports history of same and reports pain intensifies with deep breath. Pt reports "brusing easier than normal as well".

## 2020-09-21 ENCOUNTER — Other Ambulatory Visit: Payer: Self-pay

## 2020-09-24 ENCOUNTER — Ambulatory Visit: Payer: BC Managed Care – PPO | Admitting: Cardiology

## 2020-11-30 ENCOUNTER — Other Ambulatory Visit: Payer: Self-pay

## 2020-11-30 ENCOUNTER — Encounter: Payer: Self-pay | Admitting: Cardiology

## 2020-11-30 ENCOUNTER — Telehealth: Payer: Self-pay | Admitting: Cardiology

## 2020-11-30 ENCOUNTER — Ambulatory Visit: Payer: BC Managed Care – PPO | Admitting: Cardiology

## 2020-11-30 VITALS — BP 118/88 | HR 83 | Ht 62.0 in | Wt 204.0 lb

## 2020-11-30 DIAGNOSIS — Z01818 Encounter for other preprocedural examination: Secondary | ICD-10-CM

## 2020-11-30 DIAGNOSIS — R011 Cardiac murmur, unspecified: Secondary | ICD-10-CM

## 2020-11-30 DIAGNOSIS — R079 Chest pain, unspecified: Secondary | ICD-10-CM | POA: Diagnosis not present

## 2020-11-30 DIAGNOSIS — I471 Supraventricular tachycardia: Secondary | ICD-10-CM | POA: Diagnosis not present

## 2020-11-30 NOTE — Telephone Encounter (Signed)
   [facility] Pre-operative Risk Assessment    Patient Name: Sophia Scott  DOB: 1960/07/18 MRN: [id number]  HEARTCARE STAFF:  - IMPORTANT!!!!!! Under Visit Info/Reason for Call, type in Other and utilize the format Clearance MM/DD/YY or Clearance TBD. Do not use dashes or single digits. - Please review there is not already an duplicate clearance open for this procedure. - If request is for dental extraction, please clarify the # of teeth to be extracted. - If the patient is currently at the dentist's office, call Pre-Op Callback Staff (MA/nurse) to input urgent request.  - If the patient is not currently in the dentist office, please route to the Pre-Op pool.  Request for surgical clearance:  What type of surgery is being performed? Open reduction internal fixation left ring finger meta-carpal fracture  When is this surgery scheduled? 12/05/20   What type of clearance is required (medical clearance vs. Pharmacy clearance to hold med vs. Both)? Cardiac clearance  Are there any medications that need to be held prior to surgery and how long?   Practice name and name of physician performing surgery? [facility] /Dr. Oneta Rack  What is the office phone number? [phone]   7.   What is the office fax number? [phone]  8.   Anesthesia type (None, local, MAC, general) ? Auxiliary block with MAC   Ermelinda Das 11/30/2020, 12:35 PM  _________________________________________________________________   (provider comments below)

## 2020-11-30 NOTE — Patient Instructions (Signed)
Medication Instructions:  Your physician recommends that you continue on your current medications as directed. Please refer to the Current Medication list given to you today.  Testing/Procedures: Your physician has requested that you have an echocardiogram. Echocardiography is a painless test that uses sound waves to create images of your heart. It provides your doctor with information about the size and shape of your heart and how well your heart's chambers and valves are working. This procedure takes approximately one hour. There are no restrictions for this procedure. This will be done at our [facility] location:  [address]  Follow-Up: At [facility], you and your health needs are our priority.  As part of our continuing mission to provide you with exceptional heart care, we have created designated Provider Care Teams.  These Care Teams include your primary Cardiologist (physician) and Advanced Practice Providers (APPs -  Physician Assistants and Nurse Practitioners) who all work together to provide you with the care you need, when you need it.  We recommend signing up for the patient portal called "MyChart".  Sign up information is provided on this After Visit Summary.  MyChart is used to connect with patients for Virtual Visits (Telemedicine).  Patients are able to view lab/test results, encounter notes, upcoming appointments, etc.  Non-urgent messages can be sent to your provider as well.   To learn more about what you can do with MyChart, go to [email or web address].    Your next appointment:   Follow up as scheduled

## 2020-11-30 NOTE — Telephone Encounter (Signed)
   Name: Sophia Scott  DOB: Jul 03, 1960  MRN: [id number]  Primary Cardiologist: Has seen Dr. Dulce Sellar in [facility] - scheduled to see Dr. Tomie China 03/2021  Chart reviewed as part of pre-operative protocol coverage. Because of [name]'s past medical history and time since last visit, she will require a follow-up visit in order to better assess preoperative cardiovascular risk.  Pre-op covering staff: - Please schedule URGENT appointment and call patient to inform them. Please add "pre-op clearance" to the appointment notes so provider is aware. - Please contact requesting surgeon's office via preferred method (i.e, phone, fax) to inform them of need for appointment prior to surgery.    Beatriz Stallion, PA-C  11/30/2020, 2:10 PM

## 2020-11-30 NOTE — Telephone Encounter (Signed)
I searched all of our locations for an appt that I may get the pt scheduled on for URGENT Pre Op Clearance. I sent a message to Dr. Jerene Pitch at the [facility] office to see if he would be able to assist in the endeavor as to needing the URGENT pre op appt. Dr. Jerene Pitch, so kindly accepted to help and see the pt today at 4:20 at the [facility] office. Pt is agreeable to this plan of care. Pt was last seen 2019 and is technically a NEW PT again. She was supposed to re-est with Dr. Tomie China back in 09/2020 though no showed for that appt and has been rescheduled to 03/2021 with Dr. Tomie China. I did inform the pt that Dr. Jerene Pitch will see her today to see if he can clear her for her URGENT surgery. Pt aware that Dr. Tomie China will be her primary card and to follow up in 03/2021. Pt is very grateful to our help today. I will forward notes to MD for appt today. I will send FYI to surgeon's office pt being seen today.

## 2020-11-30 NOTE — Progress Notes (Signed)
Cardiology Office Note:    Date:  11/30/2020   ID:  Sophia Scott 04/24/1960, MRN [id number]  PCP:  Jamal Collin, PA-C  Cardiologist:  None  Electrophysiologist:  Will [name], MD   Referring MD: Jamal Collin, PA-C   Chief Complaint  Patient presents with   Pre-op Exam     History of Present Illness:    Sophia Scott is a 60 y.o. female with a hx of hypertension, left bundle branch block, SVT status post ablation 06/2017 who presents is referred by Jamal Collin, PA for preop evaluation prior to finger surgery.  Recently fell into a nightstand and suffered finger fracture.  Surgery planned for 11/2.  Planning surgery with axillary block.  Reports had episode of chest pain 2 months ago.  Describes continuous chest pain for multiple days.  Describes as pressure in the center of her chest.  She was seen in the ED on 09/19/20.  D-dimer, troponin is negative.  Reports no chest pain since ED visit.  She thinks was related to anxiety, has had sertraline dose adjusted and no recent episodes.  Reports she was going for walks 3 days/week but recently has not been walking as much since she is taking care of her mother.  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  Reports she can walk up 2 flights of stairs without stopping.  Denies any exertional chest pain or dyspnea.  She denies any lightheadedness, syncope, palpitations, or lower extremity edema.  Echocardiogram 04/13/2017 showed EF 55 to 60%, no significant valvular disease.  Cardiac monitor on 04/13/2017 showed SVT with rates of 155, 6-second pause with A-V dissociation after starting beta-blocker/calcium channel blocker for SVT.  Saw Dr. Elberta Fortis in EP and underwent SVT ablation 06/04/2017.    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   clindamycin (CLEOCIN) 300 MG capsule Take 1 capsule (300 mg total) by mouth 4 (four) times daily. X 7 days   clonazePAM (KLONOPIN) 1 MG tablet TAKE 1/2 TO 1 (ONE-HALF TO ONE) TABLET BY MOUTH UP TO TWICE DAILY AS NEEDED FOR ANXIETY   cyanocobalamin (,VITAMIN B-12,) 1000 MCG/ML injection Inject 3 mLs into the muscle every 30 (thirty) days.   diazepam (VALIUM) 5 MG tablet Take 5 mg by mouth every 8 (eight) hours as needed.   HYDROcodone-acetaminophen (NORCO/VICODIN) 5-325 MG tablet Take 1-2 tablets by mouth every 4 (four) hours as needed.   hydrOXYzine (ATARAX/VISTARIL) 25 MG tablet Take 1 tablet (25 mg total) by mouth every 6 (six) hours as needed.   olmesartan (BENICAR) 40 MG tablet Take 40 mg by mouth daily.   ondansetron (  ZOFRAN) 4 MG tablet Take 4 mg by mouth every 8 (eight) hours as needed for nausea.    sertraline (ZOLOFT) 100 MG tablet Take 100 mg by mouth daily.   sertraline (ZOLOFT) 50 MG tablet Take 50 mg by mouth daily.   tiZANidine (ZANAFLEX) 4 MG capsule Take 4 mg by mouth 3 (three) times daily as needed.   traZODone (DESYREL) 50 MG tablet Take 50 mg by mouth at bedtime.    vitamin B-12 (CYANOCOBALAMIN) 1000 MCG tablet Take 1,000 mcg by mouth daily.     Allergies:   Chlorphen-pe-acetaminophen   Social History   Socioeconomic History   Marital status: Single     Spouse name: Not on file   Number of children: Not on file   Years of education: Not on file   Highest education level: Not on file  Occupational History   Not on file  Tobacco Use   Smoking status: Never   Smokeless tobacco: Never  Vaping Use   Vaping Use: Never used  Substance and Sexual Activity   Alcohol use: Yes    Comment: occassionaly   Drug use: No   Sexual activity: Not on file  Other Topics Concern   Not on file  Social History Narrative   Not on file   Social Determinants of Health   Financial Resource Strain: Not on file  Food Insecurity: Not on file  Transportation Needs: Not on file  Physical Activity: Not on file  Stress: Not on file  Social Connections: Not on file     Family History: The patient's family history includes Cancer in her father; Heart disease in her father; Hypertension in her mother.  ROS:   Please see the history of present illness.     All other systems reviewed and are negative.  EKGs/Labs/Other Studies Reviewed:    The following studies were reviewed today:   EKG:  EKG is  ordered today.  The ekg ordered today demonstrates normal sinus rhythm, left bundle branch block, rate 83  Recent Labs: 09/19/2020: BUN 11; Creatinine, Ser 0.66; Hemoglobin 14.3; Platelets 317; Potassium 3.5; Sodium 137  Recent Lipid Panel No results found for: CHOL, TRIG, HDL, CHOLHDL, VLDL, LDLCALC, LDLDIRECT  Physical Exam:    VS:  BP 118/88 (BP Location: Right Arm, Patient Position: Sitting, Cuff Size: Large)   Pulse 83   Ht 5\' 2"  (1.575 m)   Wt 204 lb (92.5 kg)   BMI 37.31 kg/m     Wt Readings from Last 3 Encounters:  11/30/20 204 lb (92.5 kg)  09/19/20 198 lb (89.8 kg)  02/07/18 174 lb (78.9 kg)     GEN:  Well nourished, well developed in no acute distress HEENT: Normal NECK: No JVD; No carotid bruits LYMPHATICS: No lymphadenopathy CARDIAC: RRR, 2/6 systolic murmur RESPIRATORY:  Clear to auscultation without rales, wheezing or rhonchi   ABDOMEN: Soft, non-tender, non-distended MUSCULOSKELETAL:  No edema; No deformity  SKIN: Warm and dry NEUROLOGIC:  Alert and oriented x 3 PSYCHIATRIC:  Normal affect   ASSESSMENT:    1. Pre-op evaluation   2. Heart murmur   3. Chest pain of uncertain etiology   4. SVT (supraventricular tachycardia) (HCC)    PLAN:    Preop evaluation: Prior to surgery for recent finger fracture.  Reports surgery is to be done with axillary block, not planning general anesthesia.  Good functional capacity, greater than 4 METS.  Denies any exertional chest pain or dyspnea.  RCRI score 0. -No further cardiac work-up  recommended prior to surgery.  Chest pain: Seen in ED for chest pain 09/2020.  Given continuous chest pain with negative troponins, suspect noncardiac pain.  She denies any pain since ED visit.  No exertional chest pain.  No further cardiac work-up recommended at this time.  Heart murmur: 2 out of 6 systolic murmur.  Reports has been told had chronic heart murmur.  Will check echocardiogram, does not need to be done prior to procedure as above  SVT: S/p ablation.  No evidence of recurrence   F/u with Dr Tomie China as scheduled for 04/02/21  Medication Adjustments/Labs and Tests Ordered: Current medicines are reviewed at length with the patient today.  Concerns regarding medicines are outlined above.  Orders Placed This Encounter  Procedures   EKG 12-Lead   ECHOCARDIOGRAM COMPLETE   No orders of the defined types were placed in this encounter.   Patient Instructions  Medication Instructions:  Your physician recommends that you continue on your current medications as directed. Please refer to the Current Medication list given to you today.  Testing/Procedures: Your physician has requested that you have an echocardiogram. Echocardiography is a painless test that uses sound waves to create images of your heart. It provides your doctor with information about the size and shape of your heart  and how well your heart's chambers and valves are working. This procedure takes approximately one hour. There are no restrictions for this procedure. This will be done at our [facility] location:  [address]  Follow-Up: At [facility], you and your health needs are our priority.  As part of our continuing mission to provide you with exceptional heart care, we have created designated Provider Care Teams.  These Care Teams include your primary Cardiologist (physician) and Advanced Practice Providers (APPs -  Physician Assistants and Nurse Practitioners) who all work together to provide you with the care you need, when you need it.  We recommend signing up for the patient portal called "MyChart".  Sign up information is provided on this After Visit Summary.  MyChart is used to connect with patients for Virtual Visits (Telemedicine).  Patients are able to view lab/test results, encounter notes, upcoming appointments, etc.  Non-urgent messages can be sent to your provider as well.   To learn more about what you can do with MyChart, go to [email or web address].    Your next appointment:   Follow up as scheduled    Signed, Little Ishikawa, MD  11/30/2020 5:08 PM    [facility]

## 2020-12-11 ENCOUNTER — Other Ambulatory Visit: Payer: Self-pay

## 2020-12-11 ENCOUNTER — Ambulatory Visit (HOSPITAL_COMMUNITY): Payer: BC Managed Care – PPO | Attending: Cardiology

## 2020-12-11 DIAGNOSIS — R011 Cardiac murmur, unspecified: Secondary | ICD-10-CM | POA: Diagnosis present

## 2020-12-11 MED ORDER — PERFLUTREN LIPID MICROSPHERE
1.0000 mL | INTRAVENOUS | Status: AC | PRN
  Administered 2020-12-11: 2 mL via INTRAVENOUS

## 2020-12-12 LAB — ECHOCARDIOGRAM COMPLETE
Area-P 1/2: 3.95 cm2
S' Lateral: 3.3 cm

## 2020-12-14 ENCOUNTER — Encounter: Payer: Self-pay | Admitting: *Deleted

## 2021-02-11 NOTE — Progress Notes (Signed)
Cardiology Office Note:    Date:  02/15/2021   ID:  Sophia Scott, DOB [birth year], MRN [id number]  PCP:  Jamal CollinHedgecock, Suzanne, PA-C  Cardiologist:  Little Ishikawahristopher L Braxton Vantrease, MD  Electrophysiologist:  Regan LemmingWill Martin Camnitz, MD   Referring MD: Jamal CollinHedgecock, Suzanne, PA-C   Chief Complaint  Patient presents with   Chest Pain    History of Present Illness:    Sophia Scott is a 61 y.o. female with a hx of hypertension, left bundle branch block, SVT status post ablation 06/2017 who presents for follow-up.  She was referred by Jamal CollinSuzanne Hedgecock, PA for preop evaluation prior to finger surgery, initially seen on 11/30/2020. Reports had episode of chest pain 2 months prior.  Describes continuous chest pain for multiple days.  Describes as pressure in the center of her chest.  She was seen in the ED on 09/19/20.  D-dimer, troponin is negative.  Reports no chest pain since ED visit.  She thinks was related to anxiety, has had sertraline dose adjusted and no recent episodes.  Reports she was going for walks 3 days/week but recently has not been walking as much since she is taking care of her mother.  She does walk a lot at work though as she works as a Human resources officerspeech therapist at school.  Reports she can walk up 2 flights of stairs without stopping.  Denies any exertional chest pain or dyspnea.  She denies any lightheadedness, syncope, palpitations, or lower extremity edema.  Echocardiogram 04/13/2017 showed EF 55 to 60%, no significant valvular disease.  Cardiac monitor on 04/13/2017 showed SVT with rates of 155, 6-second pause with A-V dissociation after starting beta-blocker/calcium channel blocker for SVT.  Saw Dr. Elberta Fortisamnitz in EP and underwent SVT ablation 06/04/2017.  Echocardiogram 12/11/2020 showed EF 50 to 55%, grade 1 diastolic dysfunction, normal RV function, no significant valvular disease.  Since last clinic visit, she reports that she is doing okay.  Reports that had SVT during surgery x2 and had to start a  beta blocker.  Denies any dyspnea, lightheadedness, syncope, lower extremity swelling, or palpitations.  Does report she has been having chest pain.  Occurs about once per month.  Describes pressure in center of her chest that lasts for about 5 minutes.  She walks 2 to 3 days/week for 30 minutes.  Has not noted chest pain with walking.   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   clonazePAM (KLONOPIN) 1 MG tablet TAKE 1/2 TO 1 (ONE-HALF TO ONE) TABLET BY MOUTH UP TO TWICE DAILY AS NEEDED FOR ANXIETY   cyanocobalamin (,VITAMIN B-12,) 1000 MCG/ML injection Inject 3 mLs into the muscle every 30 (thirty) days.   gabapentin (NEURONTIN)  300 MG capsule Take by mouth.   meloxicam (MOBIC) 7.5 MG tablet Take 7.5 mg by mouth daily.   metoprolol tartrate (LOPRESSOR) 100 MG tablet Take 2 hours prior to CT   sertraline (ZOLOFT) 100 MG tablet Take 100 mg by mouth daily.   traZODone (DESYREL) 50 MG tablet Take 50 mg by mouth at bedtime.      Allergies:   Chlorphen-pe-acetaminophen   Social History   Socioeconomic History   Marital status: Single    Spouse name: Not on file   Number of children: Not on file   Years of education: Not on file    Highest education level: Not on file  Occupational History   Not on file  Tobacco Use   Smoking status: Never   Smokeless tobacco: Never  Vaping Use   Vaping Use: Never used  Substance and Sexual Activity   Alcohol use: Yes    Comment: occassionaly   Drug use: No   Sexual activity: Not on file  Other Topics Concern   Not on file  Social History Narrative   Not on file   Social Determinants of Health   Financial Resource Strain: Not on file  Food Insecurity: Not on file  Transportation Needs: Not on file  Physical Activity: Not on file  Stress: Not on file  Social Connections: Not on file     Family History: The patient's family history includes Cancer in her father; Heart disease in her father; Hypertension in her mother.  ROS:   Please see the history of present illness.     All other systems reviewed and are negative.  EKGs/Labs/Other Studies Reviewed:    The following studies were reviewed today:   EKG:  EKG is  ordered today.  The ekg ordered today demonstrates normal sinus rhythm, left bundle branch block, rate 83  Recent Labs: 09/19/2020: Hemoglobin 14.3; Platelets 317 02/12/2021: BUN 15; Creatinine, Ser 0.66; Potassium 5.0; Sodium 137  Recent Lipid Panel No results found for: CHOL, TRIG, HDL, CHOLHDL, VLDL, LDLCALC, LDLDIRECT  Physical Exam:    VS:  BP 130/60    Pulse 88    Ht 5\' 2"  (1.575 m)    Wt 208 lb (94.3 kg)    SpO2 99%    BMI 38.04 kg/m     Wt Readings from Last 3 Encounters:  02/12/21 208 lb (94.3 kg)  11/30/20 204 lb (92.5 kg)  09/19/20 198 lb (89.8 kg)     GEN:  Well nourished, well developed in no acute distress HEENT: Normal NECK: No JVD; No carotid bruits LYMPHATICS: No lymphadenopathy CARDIAC: RRR, 2/6 systolic murmur RESPIRATORY:  Clear to auscultation without rales, wheezing or rhonchi  ABDOMEN: Soft, non-tender, non-distended MUSCULOSKELETAL:  No edema; No deformity  SKIN: Warm and dry NEUROLOGIC:  Alert and oriented x  3 PSYCHIATRIC:  Normal affect   ASSESSMENT:    1. Precordial pain   2. SVT (supraventricular tachycardia) (HCC)   3. Medication management   4. Essential hypertension     PLAN:     Chest pain: Atypical in description but does have CAD risk (age, hypertension).  Recommend coronary CTA to rule out obstructive CAD  Heart murmur: 2 out of 6 systolic murmur.  Echocardiogram 12/11/2020 showed EF 50 to 55%, grade 1 diastolic dysfunction, normal RV function, no significant valvular disease.  SVT: S/p ablation.  Reports was told had episodes of SVT during her recent surgery.  Will check Zio patch x2 weeks for further evaluation  Hypertension: On  amlodipine 5 mg daily, appears controlled   RTC in 6 months  Medication Adjustments/Labs and Tests Ordered: Current medicines are reviewed at length with the patient today.  Concerns regarding medicines are outlined above.  Orders Placed This Encounter  Procedures   CT CORONARY MORPH W/CTA COR W/SCORE W/CA W/CM &/OR WO/CM   Basic Metabolic Panel (BMET)   LONG TERM MONITOR (3-14 DAYS)   Meds ordered this encounter  Medications   metoprolol tartrate (LOPRESSOR) 100 MG tablet    Sig: Take 2 hours prior to CT    Dispense:  1 tablet    Refill:  0     Patient Instructions  Medication Instructions:  Your physician recommends that you continue on your current medications as directed. Please refer to the Current Medication list given to you today.  *If you need a refill on your cardiac medications before your next appointment, please call your pharmacy*   Lab Work: Your physician recommends that you return for lab work in:  TODAY: BMET If you have labs (blood work) drawn today and your tests are completely normal, you will receive your results only by: MyChart Message (if you have MyChart) OR A paper copy in the mail If you have any lab test that is abnormal or we need to change your treatment, we will call you to review the  results.   Testing/Procedures:   Your cardiac CT will be scheduled at one of the below locations:   [facility] [address] [place], [place] [address] [phone]   If scheduled at [facility], please arrive at the [facility] main entrance (entrance A) of [facility] 30 minutes prior to test start time. You can use the FREE valet parking offered at the main entrance (encouraged to control the heart rate for the test) Proceed to the [facility] Radiology Department (first floor) to check-in and test prep.   Please follow these instructions carefully (unless otherwise directed):   On the Night Before the Test: Be sure to Drink plenty of water. Do not consume any caffeinated/decaffeinated beverages or chocolate 12 hours prior to your test. Do not take any antihistamines 12 hours prior to your test.  On the Day of the Test: Drink plenty of water until 1 hour prior to the test. Do not eat any food 4 hours prior to the test. You may take your regular medications prior to the test.  Take metoprolol (Lopressor) two hours prior to test. FEMALES- please wear underwire-free bra if available, avoid dresses & tight clothing      After the Test: Drink plenty of water. After receiving IV contrast, you may experience a mild flushed feeling. This is normal. On occasion, you may experience a mild rash up to 24 hours after the test. This is not dangerous. If this occurs, you can take Benadryl 25 mg and increase your fluid intake. If you experience trouble breathing, this can be serious. If it is severe call 911 IMMEDIATELY. If it is mild, please call our office. If you take any of these medications: Glipizide/Metformin, Avandament, Glucavance, please do not take 48 hours after completing test unless otherwise instructed.  Please allow 2-4 weeks for scheduling of routine cardiac CTs. Some insurance companies require a pre-authorization which may delay scheduling  of this test.   For non-scheduling related questions, please contact the cardiac imaging nurse navigator should you have any questions/concerns: Rockwell Alexandria, Cardiac Imaging Nurse Navigator Larey Brick, Cardiac Imaging Nurse Navigator [place]  [facility] Direct Office Dial: [phone]   For scheduling needs, including cancellations and rescheduling, please call [place], [phone].  ZIO XT- Long Term Monitor Instructions  Your physician has requested you wear a ZIO patch monitor for 14 days.  This is a single patch monitor. [employer] supplies one patch monitor per enrollment. Additional stickers are not available. Please do not apply patch if you will be having a Nuclear Stress Test,  Echocardiogram, Cardiac CT, MRI, or Chest Xray during the period you would be wearing the  monitor. The patch cannot be worn during these tests. You cannot remove and re-apply the  ZIO XT patch monitor.  Your ZIO patch monitor will be mailed 3 day USPS to your address on file. It may take 3-5 days  to receive your monitor after you have been enrolled.  Once you have received your monitor, please review the enclosed instructions. Your monitor  has already been registered assigning a specific monitor serial # to you.  Billing and Patient Assistance Program Information  We have supplied [employer] with any of your insurance information on file for billing purposes. [employer] offers a sliding scale Patient Assistance Program for patients that do not have  insurance, or whose insurance does not completely cover the cost of the ZIO monitor.  You must apply for the Patient Assistance Program to qualify for this discounted rate.  To apply, please call [employer] at [phone], select option 4, select option 2, ask to apply for  Patient Assistance Program. Meredeth Ide will ask your household income, and how many people  are in your household. They will quote your out-of-pocket cost based on that  information.  [employer] will also be able to set up a 63-month, interest-free payment plan if needed.  Applying the monitor   Shave hair from upper left chest.  Hold abrader disc by orange tab. Rub abrader in 40 strokes over the upper left chest as  indicated in your monitor instructions.  Clean area with 4 enclosed alcohol pads. Let dry.  Apply patch as indicated in monitor instructions. Patch will be placed under collarbone on left  side of chest with arrow pointing upward.  Rub patch adhesive wings for 2 minutes. Remove white label marked "1". Remove the white  label marked "2". Rub patch adhesive wings for 2 additional minutes.  While looking in a mirror, press and release button in center of patch. A small green light will  flash 3-4 times. This will be your only indicator that the monitor has been turned on.  Do not shower for the first 24 hours. You may shower after the first 24 hours.  Press the button if you feel a symptom. You will hear a small click. Record Date, Time and  Symptom in the Patient Logbook.  When you are ready to remove the patch, follow instructions on the last 2 pages of Patient  Logbook. Stick patch monitor onto the last page of Patient Logbook.  Place Patient Logbook in the blue and white box. Use locking tab on box and tape box closed  securely. The blue and white box has prepaid postage on it. Please place it in the mailbox as  soon as possible. Your physician should have your test results approximately 7 days after the  monitor has been mailed back to [facility].  Call [facility] Customer Care at [phone] if you have questions regarding  your ZIO XT patch monitor. Call them immediately if you see an orange light blinking on your  monitor.  If your monitor falls off in less than 4 days, contact our Monitor department at [phone].  If your monitor becomes loose or falls off after 4 days call [employer] at [phone] for  suggestions on  securing your monitor     Follow-Up: At [facility], you and your health needs are our priority.  As part of our continuing mission to provide you with exceptional heart care, we have created designated Provider Care Teams.  These Care Teams include your primary Cardiologist (physician) and Advanced Practice Providers (APPs -  Physician Assistants and Nurse Practitioners) who all work together to provide you with the care you need, when you need it.  We recommend signing up for the patient portal called "MyChart".  Sign up information is provided on this After Visit Summary.  MyChart is used to connect with patients for Virtual Visits (Telemedicine).  Patients are able to view lab/test results, encounter notes, upcoming appointments, etc.  Non-urgent messages can be sent to your provider as well.   To learn more about what you can do with MyChart, go to [email or web address].    Your next appointment:   6 month(s)  The format for your next appointment:   In Person  Provider:   Little Ishikawahristopher L Kenzie Flakes, MD     Other Instructions     Signed, Little Ishikawahristopher L Muskan Bolla, MD  02/15/2021 1:12 PM    [facility]

## 2021-02-12 ENCOUNTER — Other Ambulatory Visit: Payer: Self-pay

## 2021-02-12 ENCOUNTER — Ambulatory Visit: Payer: BC Managed Care – PPO | Admitting: Cardiology

## 2021-02-12 ENCOUNTER — Encounter: Payer: Self-pay | Admitting: Cardiology

## 2021-02-12 ENCOUNTER — Ambulatory Visit (INDEPENDENT_AMBULATORY_CARE_PROVIDER_SITE_OTHER): Payer: BC Managed Care – PPO

## 2021-02-12 VITALS — BP 130/60 | HR 88 | Ht 62.0 in | Wt 208.0 lb

## 2021-02-12 DIAGNOSIS — I471 Supraventricular tachycardia: Secondary | ICD-10-CM

## 2021-02-12 DIAGNOSIS — R072 Precordial pain: Secondary | ICD-10-CM | POA: Diagnosis not present

## 2021-02-12 DIAGNOSIS — I10 Essential (primary) hypertension: Secondary | ICD-10-CM

## 2021-02-12 DIAGNOSIS — Z79899 Other long term (current) drug therapy: Secondary | ICD-10-CM

## 2021-02-12 DIAGNOSIS — R0789 Other chest pain: Secondary | ICD-10-CM

## 2021-02-12 LAB — BASIC METABOLIC PANEL
BUN/Creatinine Ratio: 23 (ref 9–23)
BUN: 15 mg/dL (ref 6–24)
CO2: 24 mmol/L (ref 20–29)
Calcium: 9.2 mg/dL (ref 8.7–10.2)
Chloride: 99 mmol/L (ref 96–106)
Creatinine, Ser: 0.66 mg/dL (ref 0.57–1.00)
Glucose: 93 mg/dL (ref 70–99)
Potassium: 5 mmol/L (ref 3.5–5.2)
Sodium: 137 mmol/L (ref 134–144)
eGFR: 102 mL/min/{1.73_m2} (ref >59)

## 2021-02-12 MED ORDER — METOPROLOL TARTRATE 100 MG PO TABS: ORAL_TABLET | ORAL | 0 refills | Status: DC

## 2021-02-12 NOTE — Progress Notes (Unsigned)
Enrolled patient for a 14 day Zio XT  monitor to be mailed to patients home  °

## 2021-02-12 NOTE — Patient Instructions (Addendum)
Medication Instructions:  Your physician recommends that you continue on your current medications as directed. Please refer to the Current Medication list given to you today.  *If you need a refill on your cardiac medications before your next appointment, please call your pharmacy*   Lab Work: Your physician recommends that you return for lab work in:  TODAY: BMET If you have labs (blood work) drawn today and your tests are completely normal, you will receive your results only by: MyChart Message (if you have MyChart) OR A paper copy in the mail If you have any lab test that is abnormal or we need to change your treatment, we will call you to review the results.   Testing/Procedures:   Your cardiac CT will be scheduled at one of the below locations:   [facility] [address] [place], [place] [address] [phone]   If scheduled at [facility], please arrive at the [facility] main entrance (entrance A) of [facility] 30 minutes prior to test start time. You can use the FREE valet parking offered at the main entrance (encouraged to control the heart rate for the test) Proceed to the [facility] Radiology Department (first floor) to check-in and test prep.   Please follow these instructions carefully (unless otherwise directed):   On the Night Before the Test: Be sure to Drink plenty of water. Do not consume any caffeinated/decaffeinated beverages or chocolate 12 hours prior to your test. Do not take any antihistamines 12 hours prior to your test.  On the Day of the Test: Drink plenty of water until 1 hour prior to the test. Do not eat any food 4 hours prior to the test. You may take your regular medications prior to the test.  Take metoprolol (Lopressor) two hours prior to test. FEMALES- please wear underwire-free bra if available, avoid dresses & tight clothing      After the Test: Drink plenty of water. After receiving IV contrast, you  may experience a mild flushed feeling. This is normal. On occasion, you may experience a mild rash up to 24 hours after the test. This is not dangerous. If this occurs, you can take Benadryl 25 mg and increase your fluid intake. If you experience trouble breathing, this can be serious. If it is severe call 911 IMMEDIATELY. If it is mild, please call our office. If you take any of these medications: Glipizide/Metformin, Avandament, Glucavance, please do not take 48 hours after completing test unless otherwise instructed.  Please allow 2-4 weeks for scheduling of routine cardiac CTs. Some insurance companies require a pre-authorization which may delay scheduling of this test.   For non-scheduling related questions, please contact the cardiac imaging nurse navigator should you have any questions/concerns: Rockwell Alexandria, Cardiac Imaging Nurse Navigator Larey Brick, Cardiac Imaging Nurse Navigator [facility] Direct Office Dial: [phone]   For scheduling needs, including cancellations and rescheduling, please call [place], [phone].  ZIO XT- Long Term Monitor Instructions  Your physician has requested you wear a ZIO patch monitor for 14 days.  This is a single patch monitor. [employer] supplies one patch monitor per enrollment. Additional stickers are not available. Please do not apply patch if you will be having a Nuclear Stress Test,  Echocardiogram, Cardiac CT, MRI, or Chest Xray during the period you would be wearing the  monitor. The patch cannot be worn during these tests. You cannot remove and re-apply the  ZIO XT patch monitor.  Your  ZIO patch monitor will be mailed 3 day USPS to your address on file. It may take 3-5 days  to receive your monitor after you have been enrolled.  Once you have received your monitor, please review the enclosed instructions. Your monitor  has already been registered assigning a specific monitor serial # to you.  Billing  and Patient Assistance Program Information  We have supplied [employer] with any of your insurance information on file for billing purposes. [employer] offers a sliding scale Patient Assistance Program for patients that do not have  insurance, or whose insurance does not completely cover the cost of the ZIO monitor.  You must apply for the Patient Assistance Program to qualify for this discounted rate.  To apply, please call [employer] at [phone], select option 4, select option 2, ask to apply for  Patient Assistance Program. Meredeth Ide will ask your household income, and how many people  are in your household. They will quote your out-of-pocket cost based on that information.  [employer] will also be able to set up a 46-month, interest-free payment plan if needed.  Applying the monitor   Shave hair from upper left chest.  Hold abrader disc by orange tab. Rub abrader in 40 strokes over the upper left chest as  indicated in your monitor instructions.  Clean area with 4 enclosed alcohol pads. Let dry.  Apply patch as indicated in monitor instructions. Patch will be placed under collarbone on left  side of chest with arrow pointing upward.  Rub patch adhesive wings for 2 minutes. Remove white label marked "1". Remove the white  label marked "2". Rub patch adhesive wings for 2 additional minutes.  While looking in a mirror, press and release button in center of patch. A small green light will  flash 3-4 times. This will be your only indicator that the monitor has been turned on.  Do not shower for the first 24 hours. You may shower after the first 24 hours.  Press the button if you feel a symptom. You will hear a small click. Record Date, Time and  Symptom in the Patient Logbook.  When you are ready to remove the patch, follow instructions on the last 2 pages of Patient  Logbook. Stick patch monitor onto the last page of Patient Logbook.  Place Patient Logbook in the blue and white box. Use locking tab  on box and tape box closed  securely. The blue and white box has prepaid postage on it. Please place it in the mailbox as  soon as possible. Your physician should have your test results approximately 7 days after the  monitor has been mailed back to [facility].  Call [facility] Customer Care at [phone] if you have questions regarding  your ZIO XT patch monitor. Call them immediately if you see an orange light blinking on your  monitor.  If your monitor falls off in less than 4 days, contact our Monitor department at [phone].  If your monitor becomes loose or falls off after 4 days call [employer] at [phone] for  suggestions on securing your monitor     Follow-Up: At [facility], you and your health needs are our priority.  As part of our continuing mission to provide you with exceptional heart care, we have created designated Provider Care Teams.  These Care Teams include your primary Cardiologist (physician) and Advanced Practice Providers (APPs -  Physician Assistants and Nurse Practitioners) who all work together to provide you with the care you need, when  you need it.  We recommend signing up for the patient portal called "MyChart".  Sign up information is provided on this After Visit Summary.  MyChart is used to connect with patients for Virtual Visits (Telemedicine).  Patients are able to view lab/test results, encounter notes, upcoming appointments, etc.  Non-urgent messages can be sent to your provider as well.   To learn more about what you can do with MyChart, go to [email or web address].    Your next appointment:   6 month(s)  The format for your next appointment:   In Person  Provider:   Little Ishikawahristopher L Schumann, MD     Other Instructions

## 2021-02-15 DIAGNOSIS — I471 Supraventricular tachycardia: Secondary | ICD-10-CM

## 2021-02-20 ENCOUNTER — Telehealth (HOSPITAL_COMMUNITY): Payer: Self-pay | Admitting: *Deleted

## 2021-02-20 NOTE — Telephone Encounter (Signed)
Reaching out to patient to offer assistance regarding upcoming cardiac imaging study; pt verbalizes understanding of appt date/time, but states she has her heart monitor on her.  We have reschedule her appointment to January 27, the day she is to take her monitor off. She is aware that the nurse navigators will call her back to review instructions closer to her appointment time.  Larey Brick RN Navigator Cardiac Imaging [facility] [phone] office [phone] cell

## 2021-02-21 ENCOUNTER — Ambulatory Visit (HOSPITAL_COMMUNITY): Admission: RE | Admit: 2021-02-21 | Payer: BC Managed Care – PPO | Source: Ambulatory Visit

## 2021-02-28 ENCOUNTER — Telehealth (HOSPITAL_COMMUNITY): Payer: Self-pay | Admitting: *Deleted

## 2021-02-28 NOTE — Telephone Encounter (Signed)
Reaching out to patient to offer assistance regarding upcoming cardiac imaging study; pt verbalizes understanding of appt date/time, parking situation and where to check in, pre-test NPO status and medications ordered, and verified current allergies; name and call back number provided for further questions should they arise  Larey Brick RN Navigator Cardiac Imaging [facility] [phone] office [phone] cell  Patient to take 100mg  metoprolol tartrate two hours prior to cardiac CT scan.  She states she is claustrophobic and plans on taking her klonopin and will have a driver. She is aware to arrive at 8:30am for her 9am scan.

## 2021-03-01 ENCOUNTER — Ambulatory Visit (HOSPITAL_COMMUNITY)
Admission: RE | Admit: 2021-03-01 | Discharge: 2021-03-01 | Disposition: A | Discharge status: Home / Self Care | Payer: BC Managed Care – PPO | Source: Ambulatory Visit | Attending: Cardiology | Admitting: Cardiology

## 2021-03-01 ENCOUNTER — Other Ambulatory Visit: Payer: Self-pay

## 2021-03-01 DIAGNOSIS — R072 Precordial pain: Secondary | ICD-10-CM | POA: Insufficient documentation

## 2021-03-01 MED ORDER — METOPROLOL TARTRATE 5 MG/5ML IV SOLN: 5.0000 mg | INTRAVENOUS | Status: DC | PRN

## 2021-03-01 MED ORDER — IOHEXOL 350 MG/ML SOLN
100.0000 mL | Freq: Once | INTRAVENOUS | Status: AC | PRN
  Administered 2021-03-01: 100 mL via INTRAVENOUS

## 2021-03-01 MED ORDER — NITROGLYCERIN 0.4 MG SL SUBL
SUBLINGUAL_TABLET | SUBLINGUAL | Status: AC
  Administered 2021-03-01: 0.8 mg via SUBLINGUAL
  Filled 2021-03-01: qty 2

## 2021-03-01 MED ORDER — METOPROLOL TARTRATE 5 MG/5ML IV SOLN
INTRAVENOUS | Status: AC
  Administered 2021-03-01: 5 mg via INTRAVENOUS
  Filled 2021-03-01: qty 10

## 2021-03-01 MED ORDER — NITROGLYCERIN 0.4 MG SL SUBL: 0.8000 mg | SUBLINGUAL_TABLET | Freq: Once | SUBLINGUAL | Status: AC

## 2021-03-28 ENCOUNTER — Telehealth: Payer: Self-pay | Admitting: *Deleted

## 2021-03-28 MED ORDER — METOPROLOL TARTRATE 25 MG PO TABS: 25.0000 mg | ORAL_TABLET | Freq: Two times a day (BID) | ORAL | 3 refills | Status: DC

## 2021-03-28 NOTE — Telephone Encounter (Signed)
pt aware of results  New script sent to the pharmacy  

## 2021-03-28 NOTE — Telephone Encounter (Signed)
-----   Message from Little Ishikawa, MD sent at 03/17/2021  3:48 PM EST ----- Frequent episodes of SVT, recommend starting metoprolol 25 mg twice daily

## 2021-04-02 ENCOUNTER — Ambulatory Visit: Payer: BC Managed Care – PPO | Admitting: Cardiology

## 2021-05-07 ENCOUNTER — Ambulatory Visit: Payer: BC Managed Care – PPO | Admitting: Cardiology

## 2021-05-21 LAB — COLOGUARD: COLOGUARD: NEGATIVE

## 2022-03-26 ENCOUNTER — Other Ambulatory Visit: Payer: Self-pay | Admitting: Cardiology

## 2022-06-01 NOTE — Progress Notes (Unsigned)
Cardiology Office Note:    Date:  06/04/2022   ID:  Ivette Loyal Cesilia, Shinn [birth year], MRN [id number]  PCP:  Jamal Collin, PA-C  Cardiologist:  Little Ishikawa, MD  Electrophysiologist:  Regan Lemming, MD   Referring MD: Jamal Collin, PA-C   Chief Complaint  Patient presents with   Irregular Heart Beat    History of Present Illness:    Sophia Scott is a 62 y.o. female with a hx of hypertension, left bundle branch block, SVT status post ablation 06/2017 who presents for follow-up.  She was referred by Jamal Collin, PA for preop evaluation prior to finger surgery, initially seen on 11/30/2020. Reports had episode of chest pain 2 months prior.  Describes continuous chest pain for multiple days.  Describes as pressure in the center of her chest.  She was seen in the ED on 09/19/20.  D-dimer, troponin is negative.  Reports no chest pain since ED visit.  She thinks was related to anxiety, has had sertraline dose adjusted and no recent episodes.  Reports she was going for walks 3 days/week but recently has not been walking as much since she is taking care of her mother.  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  Reports she can walk up 2 flights of stairs without stopping.  Denies any exertional chest pain or dyspnea.  She denies any lightheadedness, syncope, palpitations, or lower extremity edema.  Echocardiogram 04/13/2017 showed EF 55 to 60%, no significant valvular disease.  Cardiac monitor on 04/13/2017 showed SVT with rates of 155, 6-second pause with A-V dissociation after starting beta-blocker/calcium channel blocker for SVT.  Saw Dr. Elberta Fortis in EP and underwent SVT ablation 06/04/2017.  Echocardiogram 12/11/2020 showed EF 50 to 55%, grade 1 diastolic dysfunction, normal RV function, no significant valvular disease.  Coronary CTA on 03/01/2021 showed normal coronary arteries, calcium score 0.  Zio patch x 13 days 02/15/2021 showed 79  episodes of SVT, longest lasting 1 minute 53 seconds with max rate 200 bpm.  Since last clinic visit, she reports has been doing okay.  Has intermittent chest pain.  Has palpitations about every 2 to 3 weeks.  Last for 30 seconds or less.  Denies any lightheadedness or syncope, dyspnea, or lower extremity edema.  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   clonazePAM (KLONOPIN) 1 MG tablet TAKE 1/2 TO 1 (ONE-HALF TO ONE) TABLET BY MOUTH UP TO TWICE DAILY AS NEEDED FOR ANXIETY   sertraline (ZOLOFT) 100 MG tablet Take 100 mg by mouth daily.   traZODone (DESYREL) 50 MG tablet Take 50 mg by mouth at bedtime.    [  DISCONTINUED] metoprolol tartrate (LOPRESSOR) 25 MG tablet Take 1 tablet (25 mg total) by mouth 2 (two) times daily. Please call [phone] to schedule an appointment with Dr. Epifanio Lesches for future refills. Thank you. 1st attempt.     Allergies:   Chlorphen-pe-acetaminophen   Social History   Socioeconomic History   Marital status: Single    Spouse name: Not on file   Number of children: Not on file   Years of education: Not on file   Highest education level: Not on file  Occupational History   Not on file  Tobacco  Use   Smoking status: Never   Smokeless tobacco: Never  Vaping Use   Vaping Use: Never used  Substance and Sexual Activity   Alcohol use: Yes    Comment: occassionaly   Drug use: No   Sexual activity: Not on file  Other Topics Concern   Not on file  Social History Narrative   Not on file   Social Determinants of Health   Financial Resource Strain: Not on file  Food Insecurity: Not on file  Transportation Needs: Not on file  Physical Activity: Not on file  Stress: Not on file  Social Connections: Not on file     Family History: The patient's family history includes Cancer in her father; Heart disease in her father; Hypertension in her mother.  ROS:   Please see the history of present illness.     All other systems reviewed and are negative.  EKGs/Labs/Other Studies Reviewed:    The following studies were reviewed today:   EKG:  06/03/22: Left bundle branch block, normal sinus rhythm, rate 55  Recent Labs: No results found for requested labs within last 365 days.  Recent Lipid Panel No results found for: "CHOL", "TRIG", "HDL", "CHOLHDL", "VLDL", "LDLCALC", "LDLDIRECT"  Physical Exam:    VS:  BP (!) 140/90   Pulse (!) 52   Ht 5\' 2"  (1.575 m)   Wt 215 lb 6.4 oz (97.7 kg)   SpO2 97%   BMI 39.40 kg/m     Wt Readings from Last 3 Encounters:  06/03/22 215 lb 6.4 oz (97.7 kg)  02/12/21 208 lb (94.3 kg)  11/30/20 204 lb (92.5 kg)     GEN:  Well nourished, well developed in no acute distress HEENT: Normal NECK: No JVD; No carotid bruits LYMPHATICS: No lymphadenopathy CARDIAC: RRR, 2/6 systolic murmur RESPIRATORY:  Clear to auscultation without rales, wheezing or rhonchi  ABDOMEN: Soft, non-tender, non-distended MUSCULOSKELETAL:  No edema; No deformity  SKIN: Warm and dry NEUROLOGIC:  Alert and oriented x 3 PSYCHIATRIC:  Normal affect   ASSESSMENT:    1. SVT (supraventricular tachycardia)   2. Precordial pain   3. Essential hypertension   4. Snoring      PLAN:     SVT: S/p ablation in 2019.  Zio patch x 13 days 02/15/2021 showed 79 episodes of SVT, longest lasting 1 minute 53 seconds with max rate 200 bpm.   -Started metoprolol 25 mg twice daily but previously had A-V dissociation on beta-blocker, would recommend avoiding AV nodal blocking agents.  Refer to EP  Chest pain: Atypical in description.  Coronary CTA on 03/01/2021 showed normal coronary arteries, calcium score 0.    Heart murmur: 2 out of 6 systolic murmur.  Echocardiogram 12/11/2020 showed EF 50 to 55%, grade 1 diastolic dysfunction, normal RV function, no significant valvular disease.   Hypertension: On amlodipine 5 mg daily, mildly elevated in clinic today.  Asked to  check BP twice daily for next week and call with results  Snoring: STOP-BANG 4.  Check Itamar sleep study   RTC in 6 months  Medication Adjustments/Labs and Tests Ordered: Current medicines are reviewed at length with the patient today.  Concerns regarding medicines are outlined above.  Orders Placed This Encounter  Procedures   Ambulatory referral to Cardiac Electrophysiology   EKG 12-Lead   Itamar Sleep Study   No orders of the defined types were placed in this encounter.    Patient Instructions  Medication Instructions:  STOP metoprolol  *If you need a refill on your cardiac medications before your next appointment, please call your pharmacy*  Testing/Procedures: WatchPAT?  Is a FDA cleared portable home sleep study test that uses a watch and 3 points of contact to monitor 7 different channels, including your heart rate, oxygen saturations, body position, snoring, and chest motion.  The study is easy to use from the comfort of your own home and accurately detect sleep apnea.  Before bed, you attach the chest sensor, attached the sleep apnea bracelet to your nondominant hand, and attach the finger probe.  After the study, the raw data is downloaded from the watch and scored for apnea events.    For more information: [email or web address]  Patient Testing Instructions:  Do not put battery into the device until bedtime when you are ready to begin the test. Please call the support number if you need assistance after following the instructions below: 24 hour support line- [phone] or ITAMAR support at [phone] (option 2)  Download the IntelWatchPAT One" app through the google play store or App Store  Be sure to turn on or enable access to bluetooth in settlings on your smartphone/ device  Make sure no other bluetooth devices are on and within the vicinity of your smartphone/ device and WatchPAT watch during testing.  Make sure to leave your smart phone/ device plugged in and charging all night.  When ready for bed:  Follow the instructions step by step in the WatchPAT One App to activate the testing device. For additional instructions, including video instruction, visit the WatchPAT One video on Youtube. You can search for WatchPat One within Youtube (video is 4 minutes and 18 seconds) or enter: https://youtube/watch?v=BCce_vbiwxE Please note: You will be prompted to enter a Pin to connect via bluetooth when starting the test. The PIN will be assigned to you when you receive the test.  The device is disposable, but it recommended that you retain the device until you receive a call letting you know the study has been received and the results have been interpreted.  We will let you know if the study did not transmit to [place] properly after the test is completed. You do not need to call us to confirm the receipt of the test.  Please complete the test within 48 hours of receiving PIN.   Frequently Asked Questions:  What is Watch Dennie Bible one?  A single use fully disposable home sleep apnea testing device and will not need to be returned after completion.  What are the requirements to use WatchPAT one?  The be able to have a successful watchpat one sleep study, you should  have your Watch pat one device, your smart phone, watch pat one app, your PIN number and Internet access What type of phone do I need?  You should have a smart phone that uses Android 5.1 and above or any Iphone with IOS 10 and above How can I  download the [facility] one app?  Based on your device type search for WatchPAT one app either in google play for android devices or APP store for Iphone's Where will I get my PIN for the study?  Your PIN will be provided by your physician's office. It is used for authentication and if you lose/forget your PIN, please reach out to your providers office.  I do not have Internet at home. Can I do WatchPAT one study?  WatchPAT One needs Internet connection throughout the night to be able to transmit the sleep data. You can use your home/local internet or your cellular's data package. However, it is always recommended to use home/local Internet. It is estimated that between 20MB-30MB will be used with each study.However, the application will be looking for space in the phone to start the study.  What happens if I lose internet or bluetooth connection?  During the internet disconnection, your phone will not be able to transmit the sleep data. All the data, will be stored in your phone. As soon as the internet connection is back on, the phone will being sending the sleep data. During the bluetooth disconnection, WatchPAT one will not be able to to send the sleep data to your phone. Data will be kept in the [facility] one until two devices have bluetooth connection back on. As soon as the connection is back on, WatchPAT one will send the sleep data to the phone.  How long do I need to wear the WatchPAT one?  After you start the study, you should wear the device at least 6 hours.  How far should I keep my phone from the device?  During the night, your phone should be within 15 feet.  What happens if I leave the room for restroom or other reasons?  Leaving the room  for any reason will not cause any problem. As soon as your get back to the room, both devices will reconnect and will continue to send the sleep data. Can I use my phone during the sleep study?  Yes, you can use your phone as usual during the study. But it is recommended to put your watchpat one on when you are ready to go to bed.  How will I get my study results?  A soon as you completed your study, your sleep data will be sent to the provider. They will then share the results with you when they are ready.   Follow-Up: At [facility], you and your health needs are our priority.  As part of our continuing mission to provide you with exceptional heart care, we have created designated Provider Care Teams.  These Care Teams include your primary Cardiologist (physician) and Advanced Practice Providers (APPs -  Physician Assistants and Nurse Practitioners) who all work together to provide you with the care you need, when you need it.  We recommend signing up for the patient portal called "MyChart".  Sign up information is provided on this After Visit Summary.  MyChart is used to connect with patients for Virtual Visits (Telemedicine).  Patients are able to view lab/test results, encounter notes, upcoming appointments, etc.  Non-urgent messages can be sent to your provider as well.   To learn more about what you can do with MyChart, go to [email or web address].    Your next appointment:   4 month(s)  Provider:   Little Ishikawa, MD     Other Instructions You have been referred to: Dr. Elberta Fortis   Recommend Omron  upper arm blood pressure cuff  Please check your blood pressure at home twice daily, write it down.  Call the office or send message via Mychart with the readings in 1 week for Dr. Bjorn Pippin to review.      Signed, Little Ishikawa, MD  06/04/2022 9:53 AM    [facility]

## 2022-06-03 ENCOUNTER — Encounter: Payer: Self-pay | Admitting: Cardiology

## 2022-06-03 ENCOUNTER — Ambulatory Visit: Payer: BC Managed Care – PPO | Attending: Cardiology | Admitting: Cardiology

## 2022-06-03 ENCOUNTER — Telehealth: Payer: Self-pay

## 2022-06-03 ENCOUNTER — Telehealth: Payer: Self-pay | Admitting: *Deleted

## 2022-06-03 VITALS — BP 140/90 | HR 52 | Ht 62.0 in | Wt 215.4 lb

## 2022-06-03 DIAGNOSIS — R0683 Snoring: Secondary | ICD-10-CM | POA: Diagnosis not present

## 2022-06-03 DIAGNOSIS — I471 Supraventricular tachycardia, unspecified: Secondary | ICD-10-CM

## 2022-06-03 DIAGNOSIS — R072 Precordial pain: Secondary | ICD-10-CM

## 2022-06-03 DIAGNOSIS — I10 Essential (primary) hypertension: Secondary | ICD-10-CM | POA: Diagnosis not present

## 2022-06-03 NOTE — Telephone Encounter (Signed)
Secure chat message sent to Sophia Scott ok to activate itamar device. 

## 2022-06-03 NOTE — Patient Instructions (Addendum)
Medication Instructions:  STOP metoprolol  *If you need a refill on your cardiac medications before your next appointment, please call your pharmacy*  Testing/Procedures: WatchPAT?  Is a FDA cleared portable home sleep study test that uses a watch and 3 points of contact to monitor 7 different channels, including your heart rate, oxygen saturations, body position, snoring, and chest motion.  The study is easy to use from the comfort of your own home and accurately detect sleep apnea.  Before bed, you attach the chest sensor, attached the sleep apnea bracelet to your nondominant hand, and attach the finger probe.  After the study, the raw data is downloaded from the watch and scored for apnea events.   For more information: [email or web address]  Patient Testing Instructions:  Do not put battery into the device until bedtime when you are ready to begin the test. Please call the support number if you need assistance after following the instructions below: 24 hour support line- [phone] or [facility] support at [phone] (option 2)  Download the IntelWatchPAT One" app through the google play store or App Store  Be sure to turn on or enable access to bluetooth in settlings on your smartphone/ device  Make sure no other bluetooth devices are on and within the vicinity of your smartphone/ device and WatchPAT watch during testing.  Make sure to leave your smart phone/ device plugged in and charging all night.  When ready for bed:  Follow the instructions step by step in the WatchPAT One App to activate the testing device. For additional instructions, including video instruction, visit the WatchPAT One video on Youtube. You can search for WatchPat One within Youtube (video is 4 minutes and 18 seconds) or enter: https://youtube/watch?[email or web address]_vbiwxE Please note: You will be prompted to enter a Pin to connect via bluetooth when starting the test. The PIN will be assigned to you when  you receive the test.  The device is disposable, but it recommended that you retain the device until you receive a call letting you know the study has been received and the results have been interpreted.  We will let you know if the study did not transmit to [place] properly after the test is completed. You do not need to call us to confirm the receipt of the test.  Please complete the test within 48 hours of receiving PIN.   Frequently Asked Questions:  What is Watch Dennie Bible one?  A single use fully disposable home sleep apnea testing device and will not need to be returned after completion.  What are the requirements to use WatchPAT one?  The be able to have a successful watchpat one sleep study, you should have your Watch pat one device, your smart phone, watch pat one app, your PIN number and Internet access What type of phone do I need?  You should have a smart phone that uses Android 5.1 and above or any Iphone with IOS 10 and above How can I download the WatchPAT one app?  Based on your device type search for WatchPAT one app either in google play for android devices or APP store for Iphone's Where will I get my PIN for the study?  Your PIN will be provided by your physician's office. It is used for authentication and if you lose/forget your PIN, please reach out to your providers office.  I do not have Internet at home. Can I do WatchPAT one study?  WatchPAT One needs Internet connection throughout the night to be able to  transmit the sleep data. You can use your home/local internet or your cellular's data package. However, it is always recommended to use home/local Internet. It is estimated that between 20MB-30MB will be used with each study.However, the application will be looking for space in the phone to start the study.  What happens if I lose internet or bluetooth connection?  During the internet disconnection, your phone will not be able to transmit the sleep data. All the data, will be  stored in your phone. As soon as the internet connection is back on, the phone will being sending the sleep data. During the bluetooth disconnection, WatchPAT one will not be able to to send the sleep data to your phone. Data will be kept in the [facility] one until two devices have bluetooth connection back on. As soon as the connection is back on, WatchPAT one will send the sleep data to the phone.  How long do I need to wear the WatchPAT one?  After you start the study, you should wear the device at least 6 hours.  How far should I keep my phone from the device?  During the night, your phone should be within 15 feet.  What happens if I leave the room for restroom or other reasons?  Leaving the room for any reason will not cause any problem. As soon as your get back to the room, both devices will reconnect and will continue to send the sleep data. Can I use my phone during the sleep study?  Yes, you can use your phone as usual during the study. But it is recommended to put your watchpat one on when you are ready to go to bed.  How will I get my study results?  A soon as you completed your study, your sleep data will be sent to the provider. They will then share the results with you when they are ready.   Follow-Up: At [facility], you and your health needs are our priority.  As part of our continuing mission to provide you with exceptional heart care, we have created designated Provider Care Teams.  These Care Teams include your primary Cardiologist (physician) and Advanced Practice Providers (APPs -  Physician Assistants and Nurse Practitioners) who all work together to provide you with the care you need, when you need it.  We recommend signing up for the patient portal called "MyChart".  Sign up information is provided on this After Visit Summary.  MyChart is used to connect with patients for Virtual Visits (Telemedicine).  Patients are able to view lab/test results, encounter notes,  upcoming appointments, etc.  Non-urgent messages can be sent to your provider as well.   To learn more about what you can do with MyChart, go to [email or web address].    Your next appointment:   4 month(s)  Provider:   Little Ishikawa, MD     Other Instructions You have been referred to: Dr. Elberta Fortis   Recommend Omron upper arm blood pressure cuff  Please check your blood pressure at home twice daily, write it down.  Call the office or send message via Mychart with the readings in 1 week for Dr. Bjorn Pippin to review.

## 2022-06-03 NOTE — Telephone Encounter (Signed)
Called and made the patient aware that SHE may proceed with the [facility] Home Sleep Study. PIN # provided to the patient. Patient made aware that SHE will be contacted after the test has been read with the results and any recommendations. Patient verbalized understanding and thanked me for the call.   

## 2022-06-07 ENCOUNTER — Encounter (INDEPENDENT_AMBULATORY_CARE_PROVIDER_SITE_OTHER): Payer: BC Managed Care – PPO | Admitting: Cardiology

## 2022-06-07 DIAGNOSIS — G4733 Obstructive sleep apnea (adult) (pediatric): Secondary | ICD-10-CM

## 2022-06-08 ENCOUNTER — Ambulatory Visit: Payer: BC Managed Care – PPO | Attending: Cardiology

## 2022-06-08 DIAGNOSIS — R0683 Snoring: Secondary | ICD-10-CM

## 2022-06-08 DIAGNOSIS — I471 Supraventricular tachycardia, unspecified: Secondary | ICD-10-CM

## 2022-06-08 DIAGNOSIS — I10 Essential (primary) hypertension: Secondary | ICD-10-CM

## 2022-06-08 NOTE — Procedures (Signed)
SLEEP STUDY REPORT Patient Information Study Date: 06/07/2022 Patient Name: Sophia Scott Patient ID: [id number] Birth Date: 1960/11/13 Age: 62 Gender: Female BMI: 39.8 (W=216 lb, H=5' 2'') Referring Physician: Epifanio Lesches, MD  TEST DESCRIPTION: Home sleep apnea testing was completed using the WatchPat, a Type 1 device, utilizing peripheral arterial tonometry (PAT), chest movement, actigraphy, pulse oximetry, pulse rate, body position and snore. AHI was calculated with apnea and hypopnea using valid sleep time as the denominator. RDI includes apneas, hypopneas, and RERAs. The data acquired and the scoring of sleep and all associated events were performed in accordance with the recommended standards and specifications as outlined in the AASM Manual for the Scoring of Sleep and Associated Events 2.2.0 (2015).   FINDINGS:   1. Mild Obstructive Sleep Apnea with AHI 8/hr.   2. No Central Sleep Apnea with pAHIc 0/hr.   3. Oxygen desaturations as low as 77%.   4. Mild snoring was present. O2 sats were < 88% for 78.6 min.   5. Total sleep time was 5 hrs and 6 min.   6. 20.5% of total sleep time was spent in REM sleep.   7. Shortened sleep onset latency at 7 min.   8. Prolonged REM sleep onset latency at 140 min.   9. Total awakenings were 7.  10. Arrhythmia detection:  None.  DIAGNOSIS: Mild Obstructive Sleep Apnea (G47.33) Nocturnal Hypoxemia  RECOMMENDATIONS:   1.  Clinical correlation of these findings is necessary.  The decision to treat obstructive sleep apnea (OSA) is usually based on the presence of apnea symptoms or the presence of associated medical conditions such as Hypertension, Congestive Heart Failure, Atrial Fibrillation or Obesity.  The most common symptoms of OSA are snoring, gasping for breath while sleeping, daytime sleepiness and fatigue.   2.  Initiating apnea therapy is recommended given the presence of symptoms and/or associated conditions. Recommend  proceeding with one of the following:     a.  Auto-CPAP therapy with a pressure range of 5-20cm H2O.     b.  An oral appliance (OA) that can be obtained from certain dentists with expertise in sleep medicine.  These are primarily of use in non-obese patients with mild and moderate disease.     c.  An ENT consultation which may be useful to look for specific causes of obstruction and possible treatment options.     d.  If patient is intolerant to PAP therapy, consider referral to ENT for evaluation for hypoglossal nerve stimulator.   3.  Close follow-up is necessary to ensure success with CPAP or oral appliance therapy for maximum benefit.  4.  A follow-up oximetry study on CPAP is recommended to assess the adequacy of therapy and determine the need for supplemental oxygen or the potential need for Bi-level therapy.  An arterial blood gas to determine the adequacy of baseline ventilation and oxygenation should also be considered.  5.  Healthy sleep recommendations include:  adequate nightly sleep (normal 7-9 hrs/night), avoidance of caffeine after noon and alcohol near bedtime, and maintaining a sleep environment that is cool, dark and quiet.  6.  Weight loss for overweight patients is recommended.  Even modest amounts of weight loss can significantly improve the severity of sleep apnea.  7.  Snoring recommendations include:  weight loss where appropriate, side sleeping, and avoidance of alcohol before bed.  8.  Operation of motor vehicle should be avoided when sleepy.  Signature: Armanda Magic, MD; [facility]; Diplomat, American Board of Sleep Medicine Electronically  Signed: 06/08/2022 10:23:56 AM

## 2022-07-03 ENCOUNTER — Telehealth: Payer: Self-pay

## 2022-07-03 DIAGNOSIS — G4733 Obstructive sleep apnea (adult) (pediatric): Secondary | ICD-10-CM

## 2022-07-03 NOTE — Telephone Encounter (Signed)
-----   Message from Quintella Reichert, MD sent at 06/08/2022 10:26 AM EDT ----- Please let patient know that they have sleep apnea.  Recommend therapeutic CPAP titration for treatment of patient's sleep disordered breathing.  If unable to perform an in lab titration then initiate ResMed auto CPAP from 4 to 15cm H2O with heated humidity and mask of choice and overnight pulse ox on CPAP.

## 2022-07-03 NOTE — Telephone Encounter (Signed)
Patient notified of sleep study results and recommendations. All questions(if any) were answered. Patient verbalized understanding. CPAP Titration Treatment ordered today 07/03/22. 

## 2022-07-04 ENCOUNTER — Ambulatory Visit: Payer: BC Managed Care – PPO | Attending: Cardiology | Admitting: Cardiology

## 2022-07-04 ENCOUNTER — Encounter: Payer: Self-pay | Admitting: Cardiology

## 2022-07-04 VITALS — BP 128/80 | HR 82 | Ht 62.0 in | Wt 217.0 lb

## 2022-07-04 DIAGNOSIS — I10 Essential (primary) hypertension: Secondary | ICD-10-CM | POA: Diagnosis not present

## 2022-07-04 DIAGNOSIS — I471 Supraventricular tachycardia, unspecified: Secondary | ICD-10-CM

## 2022-07-04 MED ORDER — AMLODIPINE BESYLATE 10 MG PO TABS: 10.0000 mg | ORAL_TABLET | Freq: Every day | ORAL | 2 refills | Status: AC

## 2022-07-04 NOTE — Progress Notes (Signed)
Electrophysiology Office Note   Date:  07/04/2022   ID:  Scott, Sophia 10-26-1960, MRN [id number]  PCP:  Jamal Collin, PA-C  Cardiologist:  Bjorn Pippin Primary Electrophysiologist:  Bracy Pepper [name], MD    Chief Complaint: palpitations   History of Present Illness: Sophia Scott is a 62 y.o. female who is being seen today for the evaluation of palpitations at the request of Little Ishikawa*. Presenting today for electrophysiology evaluation.  She has a history significant for hypertension, left bundle branch block, SVT.  She had an ablation attempt in 2019, though SVT was not inducible.  She has intermittent palpitations.  She wore a cardiac monitor a year ago that showed episodes of sinus rhythm and sinus tachycardia associated with palpitations.  She had left bundle branch block on the cardiac monitor.  This is a chronic problem.  Currently she feels well.  She has no chest pain or shortness of breath.  She is able to do all of her daily activities.  Palpitations are an annoyance, but not overly limiting.  Today, she denies symptoms of palpitations, chest pain, shortness of breath, orthopnea, PND, lower extremity edema, claudication, dizziness, presyncope, syncope, bleeding, or neurologic sequela. The patient is tolerating medications without difficulties.    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 90 tablet 2   clonazePAM (KLONOPIN) 1 MG tablet TAKE 1/2 TO 1 (ONE-HALF TO ONE) TABLET BY MOUTH UP TO TWICE DAILY AS NEEDED FOR ANXIETY     nitrofurantoin, macrocrystal-monohydrate, (MACROBID) 100 MG capsule Take 100 mg by mouth 2 (two) times daily.     sertraline (ZOLOFT) 100 MG tablet Take 100 mg by mouth daily.     traZODone (DESYREL) 50 MG tablet Take 50 mg by mouth at bedtime.      No current facility-administered medications for this visit.    Allergies:   Chlorphen-pe-acetaminophen   Social History:  The patient  reports that she has never smoked. She has never used smokeless tobacco. She reports current alcohol use. She reports that she does not use drugs.   Family History:  The patient's family history includes Cancer in her father; Heart disease in her father; Hypertension in her mother.    ROS:  Please see the history of present illness.   Otherwise, review of systems is positive for none.   All other systems are reviewed and negative.    PHYSICAL EXAM: VS:  BP 128/80   Pulse 82   Ht 5\' 2"  (1.575 m)   Wt 217 lb (98.4 kg)   SpO2 96%   BMI 39.69 kg/m  , BMI Body mass index is 39.69 kg/m. GEN:  Well nourished, well developed, in no acute distress  HEENT: normal  Neck: no JVD, carotid bruits, or masses Cardiac: RRR; no murmurs, rubs, or gallops,no edema  Respiratory:  clear to auscultation bilaterally, normal work of breathing GI: soft, nontender, nondistended, + BS MS: no deformity or atrophy  Skin: warm and dry Neuro:  Strength and sensation are intact Psych: euthymic mood, full affect  EKG:  EKG is ordered today. Personal review of the ekg ordered shows sinus rhythm, LBBB  Recent Labs: No results found for requested labs within last 365  days.    Lipid Panel  No results found for: "CHOL", "TRIG", "HDL", "CHOLHDL", "VLDL", "LDLCALC", "LDLDIRECT"   Wt Readings from Last 3 Encounters:  07/04/22 217 lb (98.4 kg)  06/03/22 215 lb 6.4 oz (97.7 kg)  02/12/21 208 lb (94.3 kg)      Other studies Reviewed: Additional studies/ records that were reviewed today include: TTE 12/12/20  Review of the above records today demonstrates:   1. Left ventricular ejection fraction, by estimation, is 50 to 55%. The  left ventricle has low normal function. The left ventricle has no regional  wall motion abnormalities. Left ventricular diastolic parameters are  consistent with Grade I diastolic  dysfunction (impaired relaxation). Elevated left atrial pressure.   2. Right ventricular systolic function is normal. The right ventricular  size is normal.   3. The mitral valve is normal in structure. No evidence of mitral valve  regurgitation. No evidence of mitral stenosis.   4. The aortic valve was not well visualized. Aortic valve regurgitation  is not visualized. No aortic stenosis is present.   Cardiac monitor 03/17/2021 personally reviewed 79 episodes of SVT, longest lasting 1 minute 53 seconds with max rate 200 bpm 1 episode of NSVT lasting 5 beats   ASSESSMENT AND PLAN:  1.  SVT: Episodes were noninducible at EP study in 2019.  She is continue to have episodes of palpitations with a monitor showing sinus rhythm and sinus tachycardia, not consistent with her prior episodes of SVT.  I have assured her that these are not risky or dangerous arrhythmias.  For now she she has elected to continue to monitor without further intervention.  2.  Hypertension: Blood pressure is normal today.  She is a brings in blood pressure recordings that have been elevated in the past.  Leam Madero increase Norvasc to 10 mg.    Current medicines are reviewed at length with the patient today.   The patient does not have concerns regarding her medicines.  The  following changes were made today:  increase norvasc  Labs/ tests ordered today include:  No orders of the defined types were placed in this encounter.    Disposition:   FU with Toyna Erisman  PRN  Signed, Marcello Tuzzolino [name], MD  07/04/2022 12:11 PM     [facility] [address] [place] [address] [phone] (office) [phone] (fax)

## 2022-07-04 NOTE — Patient Instructions (Addendum)
Medication Instructions:  Your physician has recommended you make the following change in your medication:  INCREASE Amlodipine (Norvasc) to 10 mg daily (If you have any issues after this increase, or it is not improving blood pressure - please call Dr. Bjorn Pippin for further advisement)  *If you need a refill on your cardiac medications before your next appointment, please call your pharmacy*   Lab Work: None ordered   Testing/Procedures: None ordered   Follow-Up: At [facility], you and your health needs are our priority.  As part of our continuing mission to provide you with exceptional heart care, we have created designated Provider Care Teams.  These Care Teams include your primary Cardiologist (physician) and Advanced Practice Providers (APPs -  Physician Assistants and Nurse Practitioners) who all work together to provide you with the care you need, when you need it.  Your next appointment:   as  needed  The format for your next appointment:   In Person  Provider:   Loman Brooklyn, MD    Thank you for choosing [facility]!!   Dory Horn, RN [phone]

## 2022-07-08 ENCOUNTER — Telehealth: Payer: Self-pay

## 2022-07-08 DIAGNOSIS — G4733 Obstructive sleep apnea (adult) (pediatric): Secondary | ICD-10-CM

## 2022-07-08 NOTE — Telephone Encounter (Signed)
CPAP Titration ordered  07/08/22 

## 2022-07-31 NOTE — Addendum Note (Signed)
Addended by: Brunetta Genera on: 07/31/2022 02:16 PM   Modules accepted: Orders

## 2022-09-01 NOTE — Progress Notes (Unsigned)
Cardiology Office Note:    Date:  09/04/2022   ID:  Coye, Kassam 1960-04-18, MRN [id number]  PCP:  Jamal Collin, PA-C  Cardiologist:  Little Ishikawa, MD  Electrophysiologist:  Regan Lemming, MD   Referring MD: Jamal Collin, PA-C   Chief Complaint  Patient presents with   SVT    History of Present Illness:    Sophia Scott is a 62 y.o. female with a hx of hypertension, left bundle branch block, SVT status post ablation 06/2017 who presents for follow-up.  She was referred by Jamal Collin, PA for preop evaluation prior to finger surgery, initially seen on 11/30/2020. Reports had episode of chest pain 2 months prior.  Describes continuous chest pain for multiple days.  Describes as pressure in the center of her chest.  She was seen in the ED on 09/19/20.  D-dimer, troponin is negative.  Reports no chest pain since ED visit.  She thinks was related to anxiety, has had sertraline dose adjusted and no recent episodes.  Reports she was going for walks 3 days/week but recently has not been walking as much since she is taking care of her mother.  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  Reports she can walk up 2 flights of stairs without stopping.  Denies any exertional chest pain or dyspnea.  She denies any lightheadedness, syncope, palpitations, or lower extremity edema.  Echocardiogram 04/13/2017 showed EF 55 to 60%, no significant valvular disease.  Cardiac monitor on 04/13/2017 showed SVT with rates of 155, 6-second pause with A-V dissociation after starting beta-blocker/calcium channel blocker for SVT.  Saw Dr. Elberta Fortis in EP and underwent SVT ablation 06/04/2017.  Echocardiogram 12/11/2020 showed EF 50 to 55%, grade 1 diastolic dysfunction, normal RV function, no significant valvular disease.  Coronary CTA on 03/01/2021 showed normal coronary arteries, calcium score 0.  Zio patch x 13 days 02/15/2021 showed 79 episodes of SVT,  longest lasting 1 minute 53 seconds with max rate 200 bpm.  Since last clinic visit, she reports she is doing well.  Denies any chest pain, dyspnea, lightheadedness, syncope, lower extremity edema.  Reports occasional palpitations lasting for few seconds.  States that she goes to gym 4 to 5 days/week, does treadmill and bike.  Planning trip to [place] in October.  Does report she has been having pain in the back of her legs with walking recently.  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   clonazePAM (KLONOPIN) 1 MG tablet TAKE 1/2 TO 1 (ONE-HALF TO ONE) TABLET BY MOUTH UP TO TWICE DAILY AS NEEDED FOR ANXIETY  methocarbamol (ROBAXIN) 500 MG tablet Take 500 mg by mouth as needed.   nitrofurantoin, macrocrystal-monohydrate, (MACROBID) 100 MG capsule Take 100 mg by mouth 2 (two) times daily.   sertraline (ZOLOFT) 100 MG tablet Take 100 mg by mouth daily.   traZODone (DESYREL) 50 MG tablet Take 50 mg by mouth at bedtime.      Allergies:   Chlorphen-pe-acetaminophen   Social History   Socioeconomic History   Marital status: Single    Spouse name: Not on file   Number of children: Not on file   Years of education: Not on  file   Highest education level: Not on file  Occupational History   Not on file  Tobacco Use   Smoking status: Never   Smokeless tobacco: Never  Vaping Use   Vaping status: Never Used  Substance and Sexual Activity   Alcohol use: Yes    Comment: occassionaly   Drug use: No   Sexual activity: Not on file  Other Topics Concern   Not on file  Social History Narrative   Not on file   Social Determinants of Health   Financial Resource Strain: Low Risk  (04/01/2021)   [facility] prior to 04/05/2022.   Overall Financial Resource Strain (CARDIA)    Difficulty of Paying Living Expenses: Not hard at all  Food Insecurity: No Food Insecurity (04/01/2021)   [facility] prior to 04/05/2022[facility] visits prior to 04/05/2022.   Hunger Vital Sign    Worried About Running Out of Food in the Last Year: Never true    Ran Out of Food in the Last Year: Never true  Transportation Needs: No Transportation Needs (04/01/2021)   [facility] prior to 04/05/2022[facility] visits prior to 04/05/2022.   PRAPARE - Administrator, Civil Service (Medical): No    Lack of Transportation (Non-Medical): No  Physical Activity: Inactive (04/01/2021)   [facility] visits prior to 04/05/2022[facility] visits prior to 04/05/2022.   Exercise Vital Sign    Days of Exercise per Week: 0 days    Minutes of Exercise per Session: 0 min  Stress: Stress Concern Present (04/01/2021)   [facility] prior to 04/05/2022[facility] visits prior to 04/05/2022.   [employer] of Occupational Health - Occupational Stress Questionnaire    Feeling of Stress : Rather much  Social Connections: Unknown (06/17/2021)   [facility], [facility]   Social Network    Social Network: Not on file  Recent Concern: Social Connections - Moderately Isolated (04/01/2021)   [facility] prior to 04/05/2022[facility] visits prior to 04/05/2022.   Social Connection and Isolation Panel [NHANES]    Frequency of Communication with Friends and Family: More than three times a week    Frequency of Social Gatherings with Friends and Family: More than three times a week    Attends Religious Services: More than 4 times per year    Active Member of Clubs or Organizations: No  Attends Banker Meetings: Patient declined    Marital Status: Divorced     Family History: The patient's family history includes Cancer in her father; Heart disease in her father; Hypertension in her mother.  ROS:   Please see the history of present illness.     All other systems reviewed and are negative.  EKGs/Labs/Other Studies Reviewed:    The following studies were reviewed today:   EKG:  06/03/22: Left bundle branch block, normal sinus rhythm, rate 55 09/04/2022: Normal sinus rhythm, rate 69, left bundle branch block sinus last time I saw  Recent Labs: No results found for requested labs within last 365 days.  Recent Lipid Panel No results found for: "CHOL", "TRIG", "HDL", "CHOLHDL", "VLDL", "LDLCALC", "LDLDIRECT"  Physical Exam:    VS:  BP 120/82 (BP Location: Left Arm, Patient Position: Sitting, Cuff Size: Large)   Pulse 69   Ht 5\' 2"  (1.575 m)   Wt 217 lb 12.8 oz (98.8 kg)   SpO2 95%   BMI 39.84 kg/m     Wt Readings from Last 3 Encounters:  09/04/22 217 lb 12.8 oz (98.8 kg)  07/04/22 217 lb (98.4 kg)  06/03/22 215  lb 6.4 oz (97.7 kg)     GEN:  Well nourished, well developed in no acute distress HEENT: Normal NECK: No JVD; No carotid bruits LYMPHATICS: No lymphadenopathy CARDIAC: RRR, 2/6 systolic murmur RESPIRATORY:  Clear to auscultation without rales, wheezing or rhonchi  ABDOMEN: Soft, non-tender, non-distended MUSCULOSKELETAL:  No edema; No deformity  SKIN: Warm and dry NEUROLOGIC:  Alert and oriented x 3 PSYCHIATRIC:  Normal affect   ASSESSMENT:    1. SVT (supraventricular tachycardia)   2. Leg pain, bilateral   3. Precordial pain   4. Essential hypertension   5. OSA (obstructive sleep apnea)      PLAN:     SVT: S/p ablation in 2019.  Zio patch x 13 days 02/15/2021 showed 79 episodes of SVT, longest lasting 1 minute 53 seconds with max rate 200 bpm.   -Started metoprolol 25 mg twice daily but previously had A-V dissociation on beta-blocker, would recommend avoiding AV nodal blocking agents.  Referred to EP, recommend monitoring for now  Chest pain: Atypical in description.  Coronary CTA on 03/01/2021 showed normal coronary arteries, calcium score 0.    Heart murmur: 2 out of 6 systolic murmur.  Echocardiogram 12/11/2020 showed EF 50 to 55%, grade 1 diastolic dysfunction, normal RV function, no significant valvular disease.  Hypertension: On amlodipine 10 mg daily.  Appears controlled  OSA: Positive sleep study 06/2022, CPAP titration recommended but has not been done.  Will reach out to sleep medicine  Leg pain: Check ABIs  RTC in 1 year  Medication Adjustments/Labs and Tests Ordered: Current medicines are reviewed at length with the patient today.  Concerns regarding medicines are outlined above.  Orders Placed This Encounter  Procedures   EKG 12-Lead   VAS [place] ABI WITH/WO TBI   No orders of the defined types were placed in this encounter.    Patient Instructions  Medication Instructions:  Your physician recommends that you continue on your current medications as  directed. Please refer to the Current Medication list given to you today.  *If you need a refill on your cardiac medications before your next appointment, please call your pharmacy*   Lab Work: None needed  If you have labs (blood work) drawn today and your tests are completely normal, you will receive your results only  by: MyChart Message (if you have MyChart) OR A paper copy in the mail If you have any lab test that is abnormal or we need to change your treatment, we will call you to review the results.   Testing/Procedures: Your physician has requested that you have an ankle brachial index (ABI). During this test an ultrasound and blood pressure cuff are used to evaluate the arteries that supply the arms and legs with blood. Allow thirty minutes for this exam. There are no restrictions or special instructions.    Follow-Up: At [facility], you and your health needs are our priority.  As part of our continuing mission to provide you with exceptional heart care, we have created designated Provider Care Teams.  These Care Teams include your primary Cardiologist (physician) and Advanced Practice Providers (APPs -  Physician Assistants and Nurse Practitioners) who all work together to provide you with the care you need, when you need it.  We recommend signing up for the patient portal called "MyChart".  Sign up information is provided on this After Visit Summary.  MyChart is used to connect with patients for Virtual Visits (Telemedicine).  Patients are able to view lab/test results, encounter notes, upcoming appointments, etc.  Non-urgent messages can be sent to your provider as well.   To learn more about what you can do with MyChart, go to [email or web address].    Your next appointment:   12 month(s)  Provider:   Little Ishikawa, MD    Signed, Little Ishikawa, MD  09/04/2022 5:12 PM    [facility]

## 2022-09-04 ENCOUNTER — Encounter: Payer: Self-pay | Admitting: Cardiology

## 2022-09-04 ENCOUNTER — Ambulatory Visit: Payer: BC Managed Care – PPO | Attending: Cardiology | Admitting: Cardiology

## 2022-09-04 VITALS — BP 120/82 | HR 69 | Ht 62.0 in | Wt 217.8 lb

## 2022-09-04 DIAGNOSIS — I10 Essential (primary) hypertension: Secondary | ICD-10-CM | POA: Diagnosis not present

## 2022-09-04 DIAGNOSIS — M79604 Pain in right leg: Secondary | ICD-10-CM

## 2022-09-04 DIAGNOSIS — I471 Supraventricular tachycardia, unspecified: Secondary | ICD-10-CM | POA: Diagnosis not present

## 2022-09-04 DIAGNOSIS — R072 Precordial pain: Secondary | ICD-10-CM | POA: Diagnosis not present

## 2022-09-04 DIAGNOSIS — M79605 Pain in left leg: Secondary | ICD-10-CM

## 2022-09-04 DIAGNOSIS — G4733 Obstructive sleep apnea (adult) (pediatric): Secondary | ICD-10-CM

## 2022-09-04 NOTE — Patient Instructions (Signed)
Medication Instructions:  Your physician recommends that you continue on your current medications as directed. Please refer to the Current Medication list given to you today.  *If you need a refill on your cardiac medications before your next appointment, please call your pharmacy*   Lab Work: None needed  If you have labs (blood work) drawn today and your tests are completely normal, you will receive your results only by: MyChart Message (if you have MyChart) OR A paper copy in the mail If you have any lab test that is abnormal or we need to change your treatment, we will call you to review the results.   Testing/Procedures: Your physician has requested that you have an ankle brachial index (ABI). During this test an ultrasound and blood pressure cuff are used to evaluate the arteries that supply the arms and legs with blood. Allow thirty minutes for this exam. There are no restrictions or special instructions.    Follow-Up: At [facility], you and your health needs are our priority.  As part of our continuing mission to provide you with exceptional heart care, we have created designated Provider Care Teams.  These Care Teams include your primary Cardiologist (physician) and Advanced Practice Providers (APPs -  Physician Assistants and Nurse Practitioners) who all work together to provide you with the care you need, when you need it.  We recommend signing up for the patient portal called "MyChart".  Sign up information is provided on this After Visit Summary.  MyChart is used to connect with patients for Virtual Visits (Telemedicine).  Patients are able to view lab/test results, encounter notes, upcoming appointments, etc.  Non-urgent messages can be sent to your provider as well.   To learn more about what you can do with MyChart, go to [email or web address].    Your next appointment:   12 month(s)  Provider:   Little Ishikawa, MD

## 2022-09-11 ENCOUNTER — Ambulatory Visit (HOSPITAL_COMMUNITY): Payer: BC Managed Care – PPO

## 2022-09-22 ENCOUNTER — Ambulatory Visit (HOSPITAL_COMMUNITY)
Admission: RE | Admit: 2022-09-22 | Discharge: 2022-09-22 | Disposition: A | Discharge status: Home / Self Care | Payer: BC Managed Care – PPO | Source: Ambulatory Visit | Attending: Cardiology | Admitting: Cardiology

## 2022-09-22 ENCOUNTER — Ambulatory Visit (HOSPITAL_COMMUNITY): Payer: BC Managed Care – PPO

## 2022-09-22 DIAGNOSIS — M79605 Pain in left leg: Secondary | ICD-10-CM | POA: Insufficient documentation

## 2022-09-22 DIAGNOSIS — M79604 Pain in right leg: Secondary | ICD-10-CM | POA: Insufficient documentation

## 2022-09-22 LAB — VAS US ABI WITH/WO TBI
Left ABI: 1.29
Right ABI: 1.22

## 2022-09-23 IMAGING — CT CT HEART MORP W/ CTA COR W/ SCORE W/ CA W/CM &/OR W/O CM
4 of 7 series · 8 of 20 positions shown, 9 images · IV contrast (omnipaque)
Comparison: None.
COMPARISON: None.

Addendum:
EXAM:
OVER-READ INTERPRETATION  CT CHEST

The following report is an over-read performed by radiologist Dr.
Rtoyota Joshjax [REDACTED] on 03/01/2021. This
over-read does not include interpretation of cardiac or coronary
anatomy or pathology. The coronary calcium score/coronary CTA
interpretation by the cardiologist is attached.
HISTORY: Chest pain, nonspecific
Cardiac/Coronary CT
TECHNIQUE: The patient was scanned on a Siemens Force scanner.
PROTOCOL: A 100 kV prospective scan was triggered in the descending thoracic
aorta at 111 HU's. Axial non-contrast 3 mm slices were carried out
through the heart. The data set was analyzed on a dedicated work
station and scored using the Agatson method. Gantry rotation speed
was 250 msecs and collimation was 0.6 mm. Heart rate optimized
medically, and 0.8 mg of sublingual nitroglycerin was given. The 3D
data set was reconstructed in 5% intervals of 35-75% of the R-R
cycle. Diastolic phases were analyzed on a dedicated work station
using MPR, MIP and VRT modes. The patient received 100mL OMNIPAQUE
IOHEXOL 350 MG/ML SOLN of contrast.

[Series 6: best diast 75 % · axial · 0.38mm/px · z∈[-146,-107]mm · 2 of 298 slices shown]
[im 100/298  vessel]
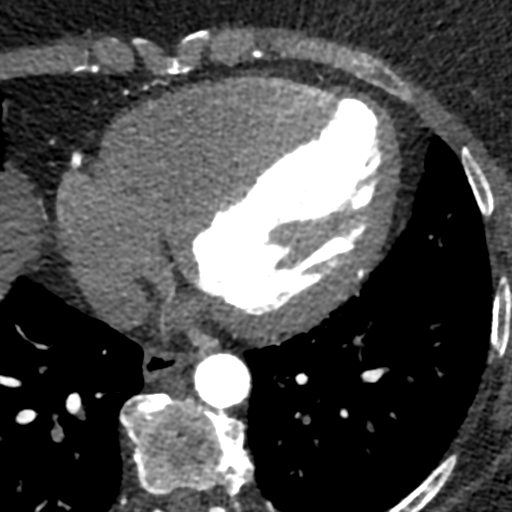
[im 199/298  vessel]
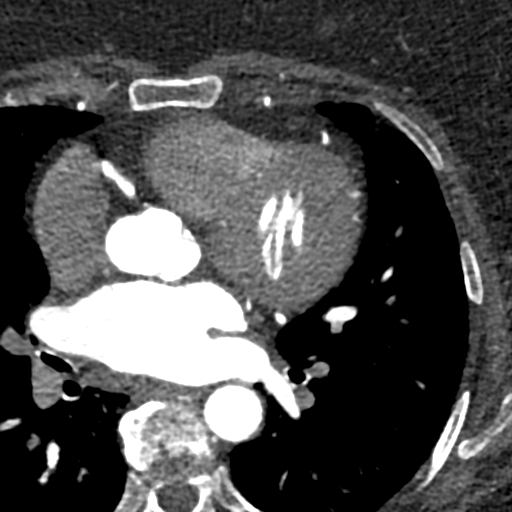

[Series 7: best syst · axial · 0.38mm/px · z∈[-146,-107]mm · 2 of 298 slices shown, 3 images]
[im 100/298  vessel]
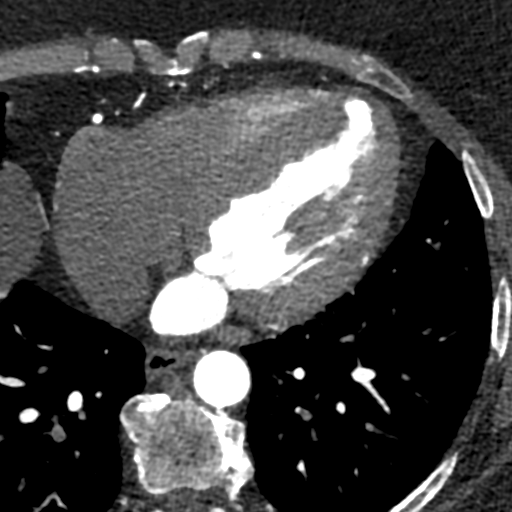
[im 100/298  lung]
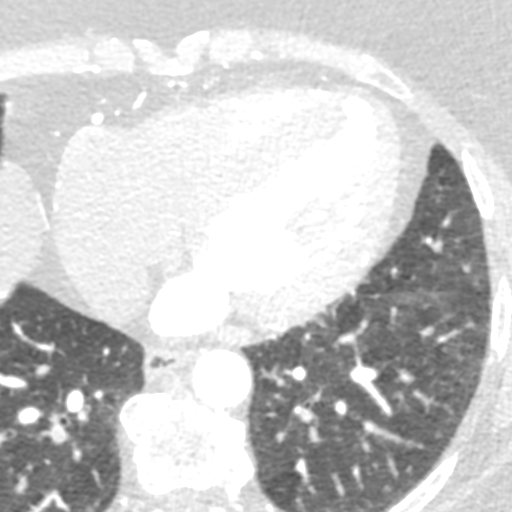
[im 199/298  vessel]
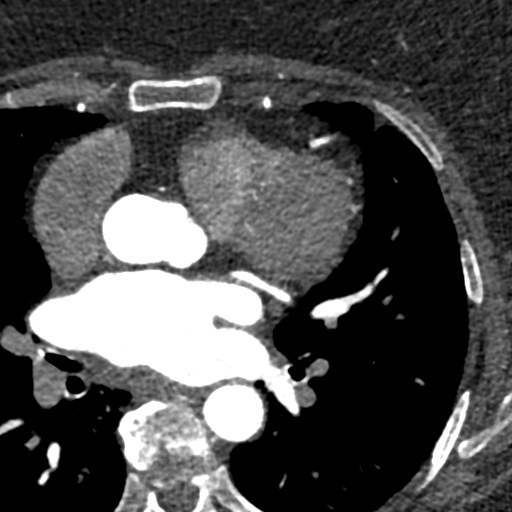

[Series 8: ts diast sharp 75 % · axial · 0.38mm/px · z∈[-146,-107]mm · 2 of 298 slices shown]
[im 100/298  lung]
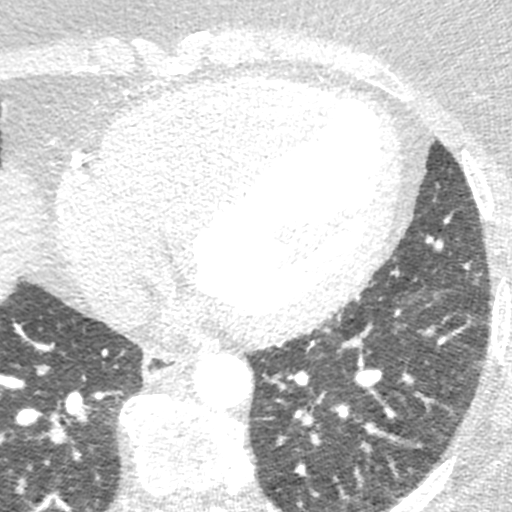
[im 199/298  lung]
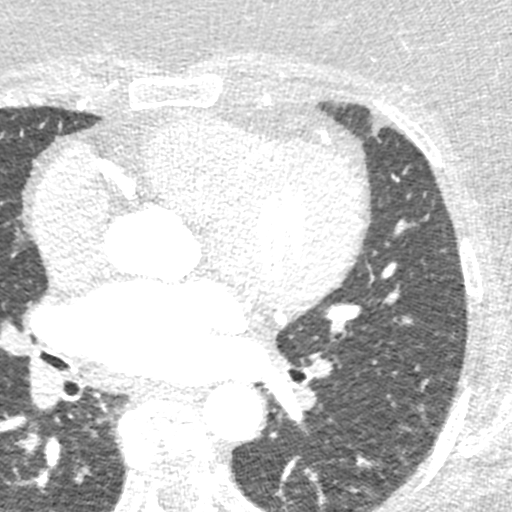

[Series 9: ts syst sharp 36 % · axial · 0.38mm/px · z∈[-146,-107]mm · 2 of 298 slices shown]
[im 100/298  lung]
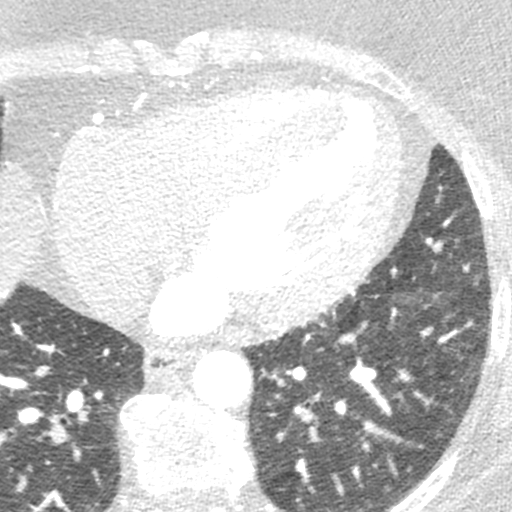
[im 199/298  lung]
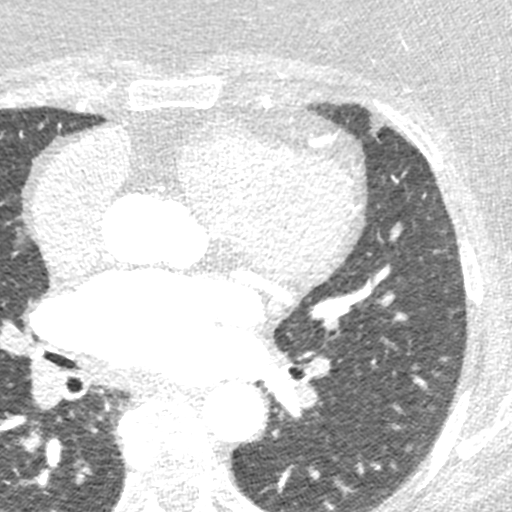

[8 of 20 positions shown; findings below may reference images not displayed]

FINDINGS: Within the visualized portions of the thorax there are no suspicious
appearing pulmonary nodules or masses, there is no acute
consolidative airspace disease, no pleural effusions, no
pneumothorax and no lymphadenopathy. Visualized portions of the
upper abdomen are unremarkable. There are no aggressive appearing
lytic or blastic lesions noted in the visualized portions of the
skeleton.
IMPRESSION: 1. No significant incidental noncardiac findings are noted.
FINDINGS: Coronary calcium score: The patient's coronary artery calcium score
is 0, which places the patient in the 0 percentile.

Coronary arteries: Normal coronary origins.  Right dominance.

Right Coronary Artery: Normal caliber vessel, gives rise to PDA. No
significant plaque or stenosis.

Left Main Coronary Artery: Normal caliber vessel. No significant
plaque or stenosis.

Left Anterior Descending Coronary Artery: Normal caliber vessel. No
significant plaque or stenosis. Gives rise to large first diagonal
branch.

Left Circumflex Artery: Normal caliber vessel. No significant plaque
or stenosis. Gives rise to large OM branch.

Aorta: Normal size, 34 mm at the mid ascending aorta (level of the
PA bifurcation) measured double oblique. No calcifications. No
dissection seen in visualized portions of the aorta.

Aortic Valve: No calcifications. Trileaflet.

Other findings:

Normal pulmonary vein drainage into the left atrium.

Normal left atrial appendage without a thrombus.

Normal size of the pulmonary artery.

Normal appearance of the pericardium.

Mild signal to noise artifact.
IMPRESSION: 1. No evidence of CAD, CADRADS = 0.

2. Coronary calcium score of 0. This was 0 percentile for age and
sex matched control.

3. Normal coronary origin with right dominance.

INTERPRETATION:

1. CAD-RADS 0: No evidence of CAD (0%). Consider non-atherosclerotic
causes of chest pain.

2. CAD-RADS 1: Minimal non-obstructive CAD (0-24%). Consider
non-atherosclerotic causes of chest pain. Consider preventive
therapy and risk factor modification.

3. CAD-RADS 2: Mild non-obstructive CAD (25-49%). Consider
non-atherosclerotic causes of chest pain. Consider preventive
therapy and risk factor modification.

4. CAD-RADS 3: Moderate stenosis (50-69%). Consider symptom-guided
anti-ischemic pharmacotherapy as well as risk factor modification
per guideline directed care. Additional analysis with CT FFR will be
submitted.

5. CAD-RADS 4: Severe stenosis. (70-99% or > 50% left main). Cardiac
catheterization or CT FFR is recommended. Consider symptom-guided
anti-ischemic pharmacotherapy as well as risk factor modification
per guideline directed care. Invasive coronary angiography
recommended with revascularization per published guideline
statements.

6. CAD-RADS 5: Total coronary occlusion (100%). Consider cardiac
catheterization or viability assessment. Consider symptom-guided
anti-ischemic pharmacotherapy as well as risk factor modification
per guideline directed care.

7. CAD-RADS N: Non-diagnostic study. Obstructive CAD can't be
excluded. Alternative evaluation is recommended.

*** End of Addendum ***
EXAM:
OVER-READ INTERPRETATION  CT CHEST

The following report is an over-read performed by radiologist Dr.
Rtoyota Joshjax [REDACTED] on 03/01/2021. This
over-read does not include interpretation of cardiac or coronary
anatomy or pathology. The coronary calcium score/coronary CTA
interpretation by the cardiologist is attached.
FINDINGS: Within the visualized portions of the thorax there are no suspicious
appearing pulmonary nodules or masses, there is no acute
consolidative airspace disease, no pleural effusions, no
pneumothorax and no lymphadenopathy. Visualized portions of the
upper abdomen are unremarkable. There are no aggressive appearing
lytic or blastic lesions noted in the visualized portions of the
skeleton.
IMPRESSION: 1. No significant incidental noncardiac findings are noted.

## 2022-11-17 ENCOUNTER — Other Ambulatory Visit: Payer: Self-pay

## 2022-11-17 ENCOUNTER — Emergency Department (HOSPITAL_BASED_OUTPATIENT_CLINIC_OR_DEPARTMENT_OTHER)
Admission: EM | Admit: 2022-11-17 | Discharge: 2022-11-17 | Disposition: A | Discharge status: Home / Self Care | Payer: BC Managed Care – PPO | Attending: Emergency Medicine | Admitting: Emergency Medicine

## 2022-11-17 ENCOUNTER — Encounter (HOSPITAL_BASED_OUTPATIENT_CLINIC_OR_DEPARTMENT_OTHER): Payer: Self-pay | Admitting: Emergency Medicine

## 2022-11-17 DIAGNOSIS — M546 Pain in thoracic spine: Secondary | ICD-10-CM | POA: Insufficient documentation

## 2022-11-17 MED ORDER — DICLOFENAC SODIUM 1 % EX GEL: 4.0000 g | Freq: Four times a day (QID) | CUTANEOUS | 0 refills | Status: AC

## 2022-11-17 MED ORDER — MORPHINE SULFATE 15 MG PO TABS: 7.5000 mg | ORAL_TABLET | ORAL | 0 refills | Status: AC | PRN

## 2022-11-17 NOTE — ED Provider Notes (Signed)
[facility] Provider Note   CSN: [id number] Arrival date & time: 11/17/22  1636     History  Chief Complaint  Patient presents with   Back Pain    Sophia Scott is a 62 y.o. female.  62 yo F with a cc of right upper back pain.  This has been going on for about 3 weeks.  Has been to urgent care multiple times.  Has done a course of steroids a course of meloxicam a course of muscle relaxants.  She went to her massage therapist who tried to massage her down.  Pain is just underneath the right scapula.  No trauma.  Denies radiation of the pain.   Back Pain      Home Medications Prior to Admission medications   Medication Sig Start Date End Date Taking? Authorizing Provider  diclofenac Sodium (VOLTAREN) 1 % GEL Apply 4 g topically 4 (four) times daily. 11/17/22  Yes Melene Plan, DO  morphine (MSIR) 15 MG tablet Take 0.5 tablets (7.5 mg total) by mouth every 4 (four) hours as needed. 11/17/22  Yes Melene Plan, DO  amLODipine (NORVASC) 10 MG tablet Take 1 tablet (10 mg total) by mouth daily. 07/04/22 10/02/22  Little Ishikawa, MD  clonazePAM (KLONOPIN) 1 MG tablet TAKE 1/2 TO 1 (ONE-HALF TO ONE) TABLET BY MOUTH UP TO TWICE DAILY AS NEEDED FOR ANXIETY 06/14/15   [provider]  methocarbamol (ROBAXIN) 500 MG tablet Take 500 mg by mouth as needed. 04/04/22   [provider]  nitrofurantoin, macrocrystal-monohydrate, (MACROBID) 100 MG capsule Take 100 mg by mouth 2 (two) times daily. 07/02/22   [provider]  sertraline (ZOLOFT) 100 MG tablet Take 100 mg by mouth daily. 11/30/14   [provider]  traZODone (DESYREL) 50 MG tablet Take 50 mg by mouth at bedtime.  02/10/17   [provider]      Allergies    Chlorphen-pe-acetaminophen    Review of Systems   Review of Systems  Musculoskeletal:  Positive for back pain.    Physical Exam Updated Vital Signs BP (!) 166/95 (BP Location: Left  Arm)   Pulse 84   Temp 99.3 F (37.4 C)   Resp 18   Wt 94.8 kg   SpO2 100%   BMI 38.23 kg/m  Physical Exam Vitals and nursing note reviewed.  Constitutional:      General: She is not in acute distress.    Appearance: She is well-developed. She is not diaphoretic.  HENT:     Head: Normocephalic and atraumatic.  Eyes:     Pupils: Pupils are equal, round, and reactive to light.  Cardiovascular:     Rate and Rhythm: Normal rate and regular rhythm.     Heart sounds: No murmur heard.    No friction rub. No gallop.  Pulmonary:     Effort: Pulmonary effort is normal.     Breath sounds: No wheezing or rales.  Abdominal:     General: There is no distension.     Palpations: Abdomen is soft.     Tenderness: There is no abdominal tenderness.  Musculoskeletal:        General: No tenderness.     Cervical back: Normal range of motion and neck supple.     Comments: Point tenderness underneath the right scapula.  There is a hypopigmented region there.  Patient said that she had cupping performed.  Skin:    General: Skin is warm  and dry.  Neurological:     Mental Status: She is alert and oriented to person, place, and time.  Psychiatric:        Behavior: Behavior normal.     ED Results / Procedures / Treatments   Labs (all labs ordered are listed, but only abnormal results are displayed) Labs Reviewed - No data to display  EKG None  Radiology No results found.  Procedures Procedures    Medications Ordered in ED Medications - No data to display  ED Course/ Medical Decision Making/ A&P                                 Medical Decision Making Risk Prescription drug management.   62 yo F with 3 weeks of right upper back pain.  Likely musculoskeletal by history and physical.  Will have her follow-up with her PCP.  6:32 PM:  I have discussed the diagnosis/risks/treatment options with the patient.  Evaluation and diagnostic testing in the emergency department does not  suggest an emergent condition requiring admission or immediate intervention beyond what has been performed at this time.  They will follow up with PCP. We also discussed returning to the ED immediately if new or worsening sx occur. We discussed the sx which are most concerning (e.g., sudden worsening pain, fever, inability to tolerate by mouth, cauda equina s/sx.  Fever) that necessitate immediate return. Medications administered to the patient during their visit and any new prescriptions provided to the patient are listed below.  Medications given during this visit Medications - No data to display   The patient appears reasonably screen and/or stabilized for discharge and I doubt any other medical condition or other [facility] requiring further screening, evaluation, or treatment in the ED at this time prior to discharge.          Final Clinical Impression(s) / ED Diagnoses Final diagnoses:  Acute right-sided thoracic back pain    Rx / DC Orders ED Discharge Orders          Ordered    morphine (MSIR) 15 MG tablet  Every 4 hours PRN        11/17/22 1827    diclofenac Sodium (VOLTAREN) 1 % GEL  4 times daily        11/17/22 1827              Melene Plan, DO 11/17/22 1832

## 2022-11-17 NOTE — Discharge Instructions (Signed)
Your back pain is most likely due to a muscular strain.  There is been a lot of research on back pain, unfortunately the only thing that seems to really help is Tylenol and ibuprofen.  Relative rest is also important to not lift greater than 10 pounds bending or twisting at the waist.  Please follow-up with your family physician.  The other thing that really seems to benefit patients is physical therapy which your doctor may send you for.  Please return to the emergency department for new numbness or weakness to your arms or legs. Difficulty with urinating or urinating or pooping on yourself.  Also if you cannot feel toilet paper when you wipe or get a fever.   Please try to follow-up with the family doctor versus taking history and physical therapy if needed.  Take 4 over the counter ibuprofen tablets 3 times a day or 2 over-the-counter naproxen tablets twice a day or the mobic once a day for pain. Use the gel as prescribed. Also take tylenol 1000mg (2 extra strength) four times a day.

## 2022-11-17 NOTE — ED Triage Notes (Signed)
Recurrent Right mid to upper back pain , was treated x 2 in 3 weeks with pain meds and steroids , massages , with some relief .  Pain is severe with movement .

## 2023-03-02 ENCOUNTER — Encounter (HOSPITAL_BASED_OUTPATIENT_CLINIC_OR_DEPARTMENT_OTHER): Payer: Self-pay

## 2023-03-02 ENCOUNTER — Telehealth: Payer: Self-pay

## 2023-03-02 ENCOUNTER — Other Ambulatory Visit: Payer: Self-pay

## 2023-03-02 ENCOUNTER — Emergency Department (HOSPITAL_BASED_OUTPATIENT_CLINIC_OR_DEPARTMENT_OTHER)
Admission: EM | Admit: 2023-03-02 | Discharge: 2023-03-03 | Discharge status: Against Medical Advice | Payer: 59 | Attending: Emergency Medicine | Admitting: Emergency Medicine

## 2023-03-02 DIAGNOSIS — R109 Unspecified abdominal pain: Secondary | ICD-10-CM | POA: Diagnosis present

## 2023-03-02 DIAGNOSIS — Z5321 Procedure and treatment not carried out due to patient leaving prior to being seen by health care provider: Secondary | ICD-10-CM | POA: Diagnosis not present

## 2023-03-02 LAB — CBC
HCT: 40.8 % (ref 36.0–46.0)
Hemoglobin: 13.3 g/dL (ref 12.0–15.0)
MCH: 29.2 pg (ref 26.0–34.0)
MCHC: 32.6 g/dL (ref 30.0–36.0)
MCV: 89.7 fL (ref 80.0–100.0)
Platelets: 256 10*3/uL (ref 150–400)
RBC: 4.55 MIL/uL (ref 3.87–5.11)
RDW: 13.4 % (ref 11.5–15.5)
WBC: 6.3 10*3/uL (ref 4.0–10.5)
nRBC: 0 % (ref 0.0–0.2)

## 2023-03-02 LAB — COMPREHENSIVE METABOLIC PANEL
ALT: 17 U/L (ref 0–44)
AST: 22 U/L (ref 15–41)
Albumin: 4.2 g/dL (ref 3.5–5.0)
Alkaline Phosphatase: 114 U/L (ref 38–126)
Anion gap: 11 (ref 5–15)
BUN: 16 mg/dL (ref 6–20)
CO2: 21 mmol/L — ABNORMAL LOW (ref 22–32)
Calcium: 9.1 mg/dL (ref 8.9–10.3)
Chloride: 102 mmol/L (ref 98–111)
Creatinine, Ser: 0.58 mg/dL (ref 0.44–1.00)
GFR, Estimated: >60 mL/min (ref >60)
Glucose, Bld: 113 mg/dL — ABNORMAL HIGH (ref 70–99)
Potassium: 4.5 mmol/L (ref 3.5–5.1)
Sodium: 134 mmol/L — ABNORMAL LOW (ref 135–145)
Total Bilirubin: 0.5 mg/dL (ref 0.0–1.2)
Total Protein: 7.4 g/dL (ref 6.5–8.1)

## 2023-03-02 LAB — LIPASE, BLOOD: Lipase: 28 U/L (ref 11–51)

## 2023-03-02 NOTE — ED Triage Notes (Signed)
Pt reports that she was at urgent care today. States that she was told to come here for evaluation of kidney stone and needing a possible scan.

## 2023-03-02 NOTE — Telephone Encounter (Addendum)
**Note De-Identified Princesa Willig Obfuscation** Per the [facility] Provider Portal: The following solutions for the service date entered do not require Pre-Authorization by [employer].  95811-CPAP Titration.  Per the pt she no longer has [employer] ins coverage as it was changed to [place] at the beginning of this year. She provided me with her new [employer] ins information as follows: ID: [id number] Benefits and claims Phone #: [phone]  I did a CPAP Titration through the [employer] provider portal and received the following message:  Status: This member does not require prior authorization for this procedure at this time. The member's employer group has opted out for the requested procedure, making them non-delegated through [employer].  To be sure this PA is not required, I called [employer] and per the virtual assistant, NO PA is required for CPT Code 82956. Call reference #: [id number]

## 2023-03-03 ENCOUNTER — Emergency Department (HOSPITAL_BASED_OUTPATIENT_CLINIC_OR_DEPARTMENT_OTHER): Payer: 59

## 2023-03-03 ENCOUNTER — Ambulatory Visit (HOSPITAL_BASED_OUTPATIENT_CLINIC_OR_DEPARTMENT_OTHER): Payer: 59

## 2023-05-22 ENCOUNTER — Ambulatory Visit (HOSPITAL_BASED_OUTPATIENT_CLINIC_OR_DEPARTMENT_OTHER): Payer: 59 | Attending: Cardiology | Admitting: Cardiology

## 2023-05-22 VITALS — Ht 62.0 in | Wt 217.0 lb

## 2023-05-22 DIAGNOSIS — G4733 Obstructive sleep apnea (adult) (pediatric): Secondary | ICD-10-CM | POA: Diagnosis present

## 2023-06-09 NOTE — Procedures (Signed)
   Maryan Smalling [facility] [address] [place], [place] [address] Tel: [phone]   Fax: [phone]  Titration Interpretation  Patient Name:  Sophia Scott, Sophia Scott Date:  05/22/2023 Referring Physician:  Gaylyn Keas MD  Indications for Polysomnography The patient is a 63 year-old Female who is 5\' 2"  and weighs 217.0 lbs. Her BMI equals 39.9.  A full night titration treatment study was performed.  Medication  Trazodone   Polysomnogram Data A full night polysomnogram recorded the standard physiologic parameters including EEG, EOG, EMG, EKG, nasal and oral airflow.  Respiratory parameters of chest and abdominal movements were recorded with Respiratory Inductance Plethysmography belts.  Oxygen saturation was recorded by pulse oximetry.   Sleep Architecture The total recording time of the polysomnogram was 383.8 minutes.  The total sleep time was 358.0 minutes.  The patient spent 1.1% of total sleep time in Stage N1, 79.9% in Stage N2, 0.0% in Stages N3, and 19.0% in REM.  Sleep latency was 5.6 minutes.  REM latency was 166.0 minutes.  Sleep Efficiency was 93.3%.  Wake after Sleep Onset time was 20.0 minutes.  Titration Summary The patient was titrated at pressures ranging from 5 cm/H20 up to 11 cm/H20.  The last pressure used in the study was 11 cm/H20.  Respiratory Events The polysomnogram revealed a presence of 1 obstructive, 0 central, and 0 mixed apneas resulting in an Apnea index of 0.2 events per hour.  There were 18 hypopneas (>=3% desaturation and/or arousal) resulting in an Apnea\Hypopnea Index (AHI >=3% desaturation and/or arousal) of 3.2 events per hour.  There were 10 hypopneas (>=4% desaturation) resulting in an Apnea\Hypopnea Index (AHI >=4% desaturation) of 1.8 events per hour.  There were 0 Respiratory Effort Related Arousals resulting in a RERA index of 0 events per hour. The Respiratory Disturbance Index is 3.2 events per hour.  The snore index was  0 events per hour.  Mean oxygen saturation was 92.3%.  The lowest oxygen saturation during sleep was 83.0%.  Time spent <=88% oxygen saturation was 5.3 minutes (1.4%).  Limb Activity There were 0 limb movements recorded.   Cardiac Summary The average pulse rate was 82.1 bpm.  The minimum pulse rate was 69.0 bpm while the maximum pulse rate was 100.0 bpm.  Cardiac rhythm was normal.  Diagnosis:  Obstructive Sleep Apnea Successful CPAP titration  Recommendations: Recommend a trial of ResMed CPAP at 11cm H2O with heated humidity and small ReMed F10 full face mask. The patient should be counseled to avoid sleeping supine. The patient should be counseled in good sleep hygiene. Encouraged patient to avoid driving when sleepy. Followup in Sleep Medicine clinic in 6 weeks.   This study was personally reviewed and electronically signed by: Gaylyn Keas MD Accredited Board Certified in Sleep Medicine Date/Time: 06/09/2023 6:19PM

## 2023-06-11 ENCOUNTER — Telehealth: Payer: Self-pay

## 2023-06-11 NOTE — Telephone Encounter (Signed)
-----   Message from Gaylyn Keas sent at 06/09/2023  6:21 PM EDT ----- Please let patient know that they had a successful PAP titration and let DME know that orders are in EPIC.  Please set up 6 week OV with me.

## 2023-06-11 NOTE — Telephone Encounter (Signed)
 Notified patient via VM, per DPR, of Successful titration. Order for PAP device and supplies sent to [facility] today.

## 2023-08-31 ENCOUNTER — Ambulatory Visit: Attending: Cardiology | Admitting: Cardiology

## 2023-08-31 ENCOUNTER — Encounter: Payer: Self-pay | Admitting: Cardiology

## 2023-08-31 VITALS — BP 130/60 | HR 75 | Ht 64.0 in | Wt 199.4 lb

## 2023-08-31 DIAGNOSIS — G4733 Obstructive sleep apnea (adult) (pediatric): Secondary | ICD-10-CM | POA: Insufficient documentation

## 2023-08-31 DIAGNOSIS — I10 Essential (primary) hypertension: Secondary | ICD-10-CM | POA: Diagnosis not present

## 2023-08-31 NOTE — Patient Instructions (Signed)
 Medication Instructions:  Your physician recommends that you continue on your current medications as directed. Please refer to the Current Medication list given to you today.  *If you need a refill on your cardiac medications before your next appointment, please call your pharmacy*  Lab Work: None.  If you have labs (blood work) drawn today and your tests are completely normal, you will receive your results only by: MyChart Message (if you have MyChart) OR A paper copy in the mail If you have any lab test that is abnormal or we need to change your treatment, we will call you to review the results.  Testing/Procedures: Dr. Shlomo has ordered an overnight oximetry study for you. DO NOT wear your CPAP for this test. Your DME company will coordinate this device/test for you.  Follow-Up: At [facility], you and your health needs are our priority.  As part of our continuing mission to provide you with exceptional heart care, our providers are all part of one team.  This team includes your primary Cardiologist (physician) and Advanced Practice Providers or APPs (Physician Assistants and Nurse Practitioners) who all work together to provide you with the care you need, when you need it.  Your next appointment:   1 year(s)  Provider:    Dr. Wilbert Shlomo, MD

## 2023-08-31 NOTE — Progress Notes (Signed)
 Sleep Medicine CONSULT Note    Date:  08/31/2023   ID:  Sophia Scott, Sophia Scott 1960/09/22, MRN [id number]  PCP:  [place] [facility], PA-C  Cardiologist: Lonni LITTIE Nanas, MD   Chief Complaint  Patient presents with   New Patient (Initial Visit)    Obstructive sleep apnea    History of Present Illness:  Sophia Scott is a 63 y.o. female who is being seen today for the evaluation of obstructive sleep apnea at the request of Lonni Mas, MD.  This is a 63 year old female with a history of hypertension, chronic left bundle branch block, palpitations who was seen by Dr. Nanas back in April 2024 and complained of snoring with a STOP-BANG score of 4.  She underwent home sleep study which demonstrated mild obstructive sleep apnea with an AHI of 8/h but significant nocturnal hypoxemia with mild snoring with an O2 sat nadir of 77% and O2 sats less than 88% for 78 minutes.  She underwent CPAP titration and was titrated to 11 cm H2O.  She is now referred for sleep medicine consultation for treatment of obstructive sleep apnea.  She is doing well with her PAP device and thinks that she has gotten used to it.  She feels the pressure is adequate and has no feelings of suffocation or gasping for breath when wearing the CPAP.  She does not like the PAP mask which is an under the nose FFM.  She tried the FFM over the bridge of her nose but it made her claustrophobic.  Since going on PAP she feels rested in the am and has no significant daytime sleepiness.  She denies any significant nasal dryness or nasal congestion.  She has a lot of problems with mouth dryness in the am.  She does not think that she snores.    Past Medical History:  Diagnosis Date   Essential hypertension 08/30/2014   Iron deficiency anemia 04/12/2015   Left bundle branch block (LBBB) 04/05/2017   Murmur 08/30/2014   OSA on CPAP    mild obstructive sleep apnea with an AHI of 8/h but significant nocturnal hypoxemia  .  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    Allergies:   Chlorphen-pe-acetaminophen    Social History   Socioeconomic History   Marital status: Single    Spouse name: Not on file   Number of children: Not on file   Years of education: Not on file   Highest education level: Not on file  Occupational History   Not on file  Tobacco Use   Smoking status: Never   Smokeless tobacco: Never  Vaping Use   Vaping status: Never Used  Substance and Sexual Activity   Alcohol use: Yes    Comment: occassionaly   Drug use: No   Sexual activity: Not  on file  Other Topics Concern   Not on file  Social History Narrative   Not on file   Social Drivers of Health   Financial Resource Strain: Low Risk  (04/01/2021)   Received from [facility] visits prior to 04/05/2022., [facility]   Overall Financial Resource Strain (CARDIA)    Difficulty of Paying Living Expenses: Not hard at all  Food Insecurity: Low Risk  (09/30/2022)   Received from [facility]   Hunger Vital Sign    Within the past 12 months, you worried that your food would run out before you got money to buy more: Never true    Within the past 12 months, the food you bought just didn't last and you didn't have money to get more. : Never true  Transportation Needs: No  Transportation Needs (09/30/2022)   Received from [facility]    In the past 12 months, has lack of reliable transportation kept you from medical appointments, meetings, work or from getting things needed for daily living? : No  Physical Activity: Inactive (04/01/2021)   Received [facility] visits prior to 04/05/2022., [facility]   Exercise Vital Sign    On average, how many days per week do you engage in moderate to strenuous exercise (like a brisk walk)?: 0 days    On average, how many minutes do you engage in exercise at this level?: 0 min  Stress: Stress Concern Present (04/01/2021)   Received from [facility] visits prior to 04/05/2022., [facility]   [employer] of Occupational Health - Occupational Stress Questionnaire    Feeling of Stress : Rather much  Social Connections: Unknown (06/17/2021)   Received from [facility]   Social Network    Social Network: Not on file  Recent Concern: Social Connections - Moderately Isolated (04/01/2021)   [facility] visits prior to 04/05/2022., [facility]   Social Connection and Isolation Panel    In a typical week, how many times do you talk on the phone with family, friends, or neighbors?: More than three times a week    How often do you get together with friends or relatives?: More than three times a week    How often do you attend church or religious services?: More than 4 times per year    Do you belong to any clubs or organizations such as church groups, unions, fraternal or athletic groups, or school groups?: No    How often do you attend meetings of the clubs or organizations you belong to?: Patient declined    Are you married, widowed, divorced, separated, never married, or living with a partner?: Divorced     Family History:  The patient's family history includes Cancer in her father; Heart disease in her father; Hypertension  in her mother.   ROS:   Please see the history of present illness.    ROS All other systems reviewed and are negative.      No data to display             PHYSICAL EXAM:   VS:  BP 130/60 (BP Location: Right Arm, Patient Position: Sitting)   Pulse 75   Ht 5' 4 (1.626 m)   Wt 199 lb 6.4 oz (90.4 kg)   SpO2 97%   BMI 34.23 kg/m    GEN: Well nourished, well developed, in no acute distress  HEENT: normal  Neck: no JVD, carotid bruits, or masses Cardiac: RRR; no murmurs, rubs, or gallops,no edema.  Intact distal pulses bilaterally.  Respiratory:  clear to auscultation bilaterally, normal work of breathing GI: soft, nontender, nondistended, + BS MS: no deformity or atrophy  Skin: warm and dry, no rash Neuro:  Alert and Oriented x 3, Strength and sensation are intact Psych: euthymic mood, full affect  Wt Readings from Last 3 Encounters:  08/31/23 199 lb 6.4 oz (90.4 kg)  05/22/23 217 lb (98.4 kg)  11/17/22 209 lb (94.8 kg)      Studies/Labs Reviewed:   Home sleep study, CPAP titration, PAP compliance download  Recent Labs: 03/02/2023: ALT 17; BUN 16; Creatinine, Ser 0.58; Hemoglobin 13.3; Platelets 256; Potassium 4.5; Sodium 134       ASSESSMENT:    1. OSA (obstructive sleep apnea)   2. Essential hypertension   3. OSA on CPAP      PLAN:  In order of problems listed above:  OSA - The patient is tolerating PAP therapy well without any problems. The PAP download performed by his DME was personally reviewed and interpreted by me today and showed an AHI of 2.2 /hr on 11 cm H2O with 77% compliance in using more than 4 hours nightly.  The patient has been using and benefiting from PAP use and will continue to benefit from therapy.  -recommended adjusting the humidity in the device -add a humidifier to her bedroom to prevent her chamber from running out of H2O. -check an ONO on CPAP  Hypertension - BP controlled on exam today - Continue amlodipine  10 mg daily with  as needed refills  Followup with me in 1 year   Time Spent: 20 minutes total time of encounter, including 15 minutes spent in face-to-face patient care on the date of this encounter. This time includes coordination of care and counseling regarding above mentioned problem list. Remainder of non-face-to-face time involved reviewing chart documents/testing relevant to the patient encounter and documentation in the medical record. I have independently reviewed documentation from referring provider  Medication Adjustments/Labs and Tests Ordered: Current medicines are reviewed at length with the patient today.  Concerns regarding medicines are outlined above.  Medication changes, Labs and Tests ordered today are listed in the Patient Instructions below.  There are no Patient Instructions on file for this visit.   Signed, Wilbert Bihari, MD  08/31/2023 1:28 PM    [facility] [address] [place], [place], [place]  [address] Phone: [phone]; Fax: [phone]

## 2023-08-31 NOTE — Addendum Note (Signed)
 Addended by: JANIT GENI CROME on: 08/31/2023 01:36 PM   Modules accepted: Orders
